# Patient Record
Sex: Female | Born: 1960
Health system: Southern US, Community
[De-identification: ages and names within clinical notes are randomized; demographics above are authoritative.]

## PROBLEM LIST (undated history)

## (undated) DIAGNOSIS — J309 Allergic rhinitis, unspecified: Secondary | ICD-10-CM

## (undated) DIAGNOSIS — Z1501 Genetic susceptibility to malignant neoplasm of breast: Secondary | ICD-10-CM

## (undated) DIAGNOSIS — Z1509 Genetic susceptibility to other malignant neoplasm: Secondary | ICD-10-CM

## (undated) DIAGNOSIS — R3129 Other microscopic hematuria: Secondary | ICD-10-CM

## (undated) DIAGNOSIS — M5410 Radiculopathy, site unspecified: Secondary | ICD-10-CM

## (undated) DIAGNOSIS — Z86010 Personal history of colonic polyps: Secondary | ICD-10-CM

## (undated) DIAGNOSIS — Z1589 Genetic susceptibility to other disease: Secondary | ICD-10-CM

## (undated) HISTORY — DX: Genetic susceptibility to other disease: Z15.89

## (undated) HISTORY — DX: Other microscopic hematuria: R31.29

## (undated) HISTORY — DX: Allergic rhinitis, unspecified: J30.9

## (undated) HISTORY — PX: OTOPLASTY: SUR998

## (undated) HISTORY — DX: Personal history of colonic polyps: Z86.010

## (undated) HISTORY — PX: TONSILLECTOMY: SUR1361

## (undated) HISTORY — DX: Genetic susceptibility to other malignant neoplasm: Z15.09

## (undated) HISTORY — PX: BARTHOLIN CYST MARSUPIALIZATION: SHX5383

## (undated) HISTORY — PX: OTHER SURGICAL HISTORY: SHX169

## (undated) HISTORY — DX: Radiculopathy, site unspecified: M54.10

## (undated) HISTORY — DX: Genetic susceptibility to malignant neoplasm of breast: Z15.01

---

## 2002-10-28 ENCOUNTER — Encounter: Admission: RE | Admit: 2002-10-28 | Discharge: 2002-10-28 | Payer: Self-pay | Admitting: Obstetrics and Gynecology

## 2002-10-28 ENCOUNTER — Encounter: Payer: Self-pay | Admitting: Obstetrics and Gynecology

## 2003-05-08 ENCOUNTER — Encounter: Admission: RE | Admit: 2003-05-08 | Discharge: 2003-05-08 | Payer: Self-pay | Admitting: Obstetrics and Gynecology

## 2003-05-18 ENCOUNTER — Other Ambulatory Visit: Admission: RE | Admit: 2003-05-18 | Discharge: 2003-05-18 | Payer: Self-pay | Admitting: Obstetrics and Gynecology

## 2004-05-08 ENCOUNTER — Encounter: Admission: RE | Admit: 2004-05-08 | Discharge: 2004-05-08 | Payer: Self-pay | Admitting: Obstetrics and Gynecology

## 2004-05-20 ENCOUNTER — Other Ambulatory Visit: Admission: RE | Admit: 2004-05-20 | Discharge: 2004-05-20 | Payer: Self-pay | Admitting: Obstetrics and Gynecology

## 2005-05-12 ENCOUNTER — Encounter: Admission: RE | Admit: 2005-05-12 | Discharge: 2005-05-12 | Payer: Self-pay | Admitting: Obstetrics and Gynecology

## 2005-06-03 ENCOUNTER — Other Ambulatory Visit: Admission: RE | Admit: 2005-06-03 | Discharge: 2005-06-03 | Payer: Self-pay | Admitting: Obstetrics and Gynecology

## 2006-05-20 ENCOUNTER — Encounter: Admission: RE | Admit: 2006-05-20 | Discharge: 2006-05-20 | Payer: Self-pay | Admitting: Obstetrics and Gynecology

## 2006-06-09 ENCOUNTER — Other Ambulatory Visit: Admission: RE | Admit: 2006-06-09 | Discharge: 2006-06-09 | Payer: Self-pay | Admitting: Obstetrics and Gynecology

## 2007-10-13 ENCOUNTER — Encounter: Admission: RE | Admit: 2007-10-13 | Discharge: 2007-10-13 | Payer: Self-pay | Admitting: Obstetrics and Gynecology

## 2007-10-20 ENCOUNTER — Encounter: Payer: Self-pay | Admitting: Obstetrics and Gynecology

## 2007-10-20 ENCOUNTER — Other Ambulatory Visit: Admission: RE | Admit: 2007-10-20 | Discharge: 2007-10-20 | Payer: Self-pay | Admitting: Obstetrics and Gynecology

## 2007-10-20 ENCOUNTER — Ambulatory Visit: Payer: Self-pay | Admitting: Obstetrics and Gynecology

## 2007-11-23 ENCOUNTER — Ambulatory Visit: Payer: Self-pay | Admitting: Family Medicine

## 2007-11-23 DIAGNOSIS — J309 Allergic rhinitis, unspecified: Secondary | ICD-10-CM

## 2007-11-23 DIAGNOSIS — Z860100 Personal history of colon polyps, unspecified: Secondary | ICD-10-CM

## 2007-11-23 DIAGNOSIS — E785 Hyperlipidemia, unspecified: Secondary | ICD-10-CM | POA: Insufficient documentation

## 2007-11-23 HISTORY — DX: Allergic rhinitis, unspecified: J30.9

## 2007-11-23 HISTORY — DX: Personal history of colon polyps, unspecified: Z86.0100

## 2007-11-23 HISTORY — DX: Personal history of colonic polyps: Z86.010

## 2007-11-25 LAB — CONVERTED CEMR LAB
Alkaline Phosphatase: 52 units/L (ref 39–117)
Bilirubin, Direct: 0.2 mg/dL (ref 0.0–0.3)
Chloride: 106 meq/L (ref 96–112)
Eosinophils Absolute: 0.1 10*3/uL (ref 0.0–0.7)
GFR calc Af Amer: 99 mL/min
GFR calc non Af Amer: 82 mL/min
HCT: 41.7 % (ref 36.0–46.0)
HDL: 35.4 mg/dL — ABNORMAL LOW (ref >39.0)
MCV: 96.8 fL (ref 78.0–100.0)
Monocytes Absolute: 0.4 10*3/uL (ref 0.1–1.0)
Monocytes Relative: 5.9 % (ref 3.0–12.0)
Neutrophils Relative %: 61.3 % (ref 43.0–77.0)
Platelets: 217 10*3/uL (ref 150–400)
Potassium: 3.6 meq/L (ref 3.5–5.1)
RDW: 11.8 % (ref 11.5–14.6)
Sodium: 144 meq/L (ref 135–145)
Total CHOL/HDL Ratio: 5
Triglycerides: 68 mg/dL (ref 0–149)
VLDL: 14 mg/dL (ref 0–40)

## 2008-10-26 ENCOUNTER — Ambulatory Visit: Payer: Self-pay | Admitting: Family Medicine

## 2008-10-26 LAB — CONVERTED CEMR LAB
Bilirubin Urine: NEGATIVE
Ketones, urine, test strip: NEGATIVE
Protein, U semiquant: NEGATIVE
Urobilinogen, UA: 0.2

## 2008-10-27 ENCOUNTER — Encounter: Payer: Self-pay | Admitting: Family Medicine

## 2008-10-30 ENCOUNTER — Telehealth: Payer: Self-pay | Admitting: Family Medicine

## 2009-06-01 ENCOUNTER — Encounter: Admission: RE | Admit: 2009-06-01 | Discharge: 2009-06-01 | Payer: Self-pay | Admitting: Obstetrics and Gynecology

## 2009-07-17 ENCOUNTER — Other Ambulatory Visit: Admission: RE | Admit: 2009-07-17 | Discharge: 2009-07-17 | Payer: Self-pay | Admitting: Obstetrics and Gynecology

## 2009-07-17 ENCOUNTER — Ambulatory Visit: Payer: Self-pay | Admitting: Obstetrics and Gynecology

## 2011-02-25 ENCOUNTER — Other Ambulatory Visit: Payer: Self-pay | Admitting: Obstetrics and Gynecology

## 2011-02-25 DIAGNOSIS — Z1231 Encounter for screening mammogram for malignant neoplasm of breast: Secondary | ICD-10-CM

## 2011-03-13 ENCOUNTER — Ambulatory Visit
Admission: RE | Admit: 2011-03-13 | Discharge: 2011-03-13 | Disposition: A | Discharge status: Home / Self Care | Payer: PRIVATE HEALTH INSURANCE | Source: Ambulatory Visit | Attending: Obstetrics and Gynecology | Admitting: Obstetrics and Gynecology

## 2011-03-13 DIAGNOSIS — Z1231 Encounter for screening mammogram for malignant neoplasm of breast: Secondary | ICD-10-CM

## 2011-10-16 ENCOUNTER — Encounter: Payer: Self-pay | Admitting: Gynecology

## 2011-10-27 ENCOUNTER — Other Ambulatory Visit (HOSPITAL_COMMUNITY)
Admission: RE | Admit: 2011-10-27 | Discharge: 2011-10-27 | Disposition: A | Discharge status: Home / Self Care | Payer: BC Managed Care – PPO | Source: Ambulatory Visit | Attending: Obstetrics and Gynecology | Admitting: Obstetrics and Gynecology

## 2011-10-27 ENCOUNTER — Encounter: Payer: Self-pay | Admitting: Obstetrics and Gynecology

## 2011-10-27 ENCOUNTER — Ambulatory Visit (INDEPENDENT_AMBULATORY_CARE_PROVIDER_SITE_OTHER): Payer: BC Managed Care – PPO | Admitting: Obstetrics and Gynecology

## 2011-10-27 VITALS — BP 110/70 | Ht 65.0 in | Wt 190.0 lb

## 2011-10-27 DIAGNOSIS — N912 Amenorrhea, unspecified: Secondary | ICD-10-CM

## 2011-10-27 DIAGNOSIS — Z01419 Encounter for gynecological examination (general) (routine) without abnormal findings: Secondary | ICD-10-CM

## 2011-10-27 NOTE — Patient Instructions (Signed)
Schedule appointment at cancer Center for genetic counseling BRCA1 and BRCA2. Even if blood test shows you are  menopausal continue to use condoms.

## 2011-10-27 NOTE — Progress Notes (Signed)
Patient came to see me today for her annual GYN exam. She has not had a period since February. She is having some hot flashes but not enough to require intervention. She is up-to-date on mammograms. She contracepts  by condom. She has had 3 family members developed breast cancer although they were all postmenopausal. Her paternal aunt has had ovarian cancer. The 3 family members with breast cancer were on her mother's side. She has never had an abnormal Pap smear. Her last Pap smear was 2011. She is having no abnormal bleeding or pelvic pain.  Physical examination:Kim Julian Reil present. HEENT within normal limits. Neck: Thyroid not large. No masses. Supraclavicular nodes: not enlarged. Breasts: Examined in both sitting and lying  position. No skin changes and no masses. Abdomen: Soft no guarding rebound or masses or hernia. Pelvic: External: Within normal limits. BUS: Within normal limits. Vaginal:within normal limits. Good estrogen effect. No evidence of cystocele rectocele or enterocele. Cervix: clean. Uterus: Normal size and shape. Adnexa: No masses. Rectovaginal exam: Confirmatory and negative. Extremities: Within normal limits.  Assessment: Secondary amenorrhea  Plan: FSH drawn. Nurse will call her with results. She understands about Provera withdrawal or bone density pending results. She will make an appointment for genetic counseling at the cancer center.The new Pap smear guidelines were discussed with the patient. Pap done.

## 2011-10-28 LAB — URINALYSIS W MICROSCOPIC + REFLEX CULTURE
Bacteria, UA: NONE SEEN
Bilirubin Urine: NEGATIVE
Casts: NONE SEEN
Crystals: NONE SEEN
Glucose, UA: NEGATIVE mg/dL
Hgb urine dipstick: NEGATIVE
Ketones, ur: NEGATIVE mg/dL
Leukocytes, UA: NEGATIVE
Nitrite: NEGATIVE
Protein, ur: NEGATIVE mg/dL
Specific Gravity, Urine: 1.018 (ref 1.005–1.030)
Squamous Epithelial / HPF: NONE SEEN
Urobilinogen, UA: 0.2 mg/dL (ref 0.0–1.0)
pH: 5 (ref 5.0–8.0)

## 2011-10-28 NOTE — Addendum Note (Signed)
Addended by: Trellis Paganini on: 10/28/2011 11:15 AM   Modules accepted: Orders

## 2011-12-05 ENCOUNTER — Ambulatory Visit (INDEPENDENT_AMBULATORY_CARE_PROVIDER_SITE_OTHER): Payer: BC Managed Care – PPO | Admitting: Family Medicine

## 2011-12-05 ENCOUNTER — Encounter: Payer: Self-pay | Admitting: Family Medicine

## 2011-12-05 ENCOUNTER — Telehealth: Payer: Self-pay | Admitting: Family Medicine

## 2011-12-05 VITALS — BP 100/70 | HR 65 | Temp 98.8°F | Ht 65.75 in | Wt 187.0 lb

## 2011-12-05 DIAGNOSIS — L989 Disorder of the skin and subcutaneous tissue, unspecified: Secondary | ICD-10-CM

## 2011-12-05 DIAGNOSIS — Z23 Encounter for immunization: Secondary | ICD-10-CM

## 2011-12-05 DIAGNOSIS — Z299 Encounter for prophylactic measures, unspecified: Secondary | ICD-10-CM

## 2011-12-05 DIAGNOSIS — M5410 Radiculopathy, site unspecified: Secondary | ICD-10-CM

## 2011-12-05 DIAGNOSIS — IMO0002 Reserved for concepts with insufficient information to code with codable children: Secondary | ICD-10-CM

## 2011-12-05 HISTORY — DX: Radiculopathy, site unspecified: M54.10

## 2011-12-05 LAB — BASIC METABOLIC PANEL
Calcium: 9.6 mg/dL (ref 8.4–10.5)
GFR: 71.81 mL/min (ref >60.00)
Glucose, Bld: 87 mg/dL (ref 70–99)
Sodium: 147 mEq/L — ABNORMAL HIGH (ref 135–145)

## 2011-12-05 LAB — LIPID PANEL
Cholesterol: 170 mg/dL (ref 0–200)
LDL Cholesterol: 116 mg/dL — ABNORMAL HIGH (ref 0–99)
VLDL: 8.2 mg/dL (ref 0.0–40.0)

## 2011-12-05 NOTE — Addendum Note (Signed)
Addended by: Azucena Freed on: 12/05/2011 10:08 AM   Modules accepted: Orders

## 2011-12-05 NOTE — Telephone Encounter (Signed)
Please let patient know: Her cholesterol is a little high, we recommend a healthy diet and regular exercise and will discuss at follow up. Her sodium and potassium were slightly elevated. Would advise stopping multivitamin and any supplements containing sodium or potassium and will recheck at follow up.
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
## 2011-12-05 NOTE — Patient Instructions (Addendum)
-  We have ordered labs or studies at this visit. It usually takes 1-2 weeks for results and processing. We will contact you with instructions IF your results are abnormal. Normal results will be released to your [facility] in 1-2 weeks. If you have not heard from [place] or can not find your results in [facility] in 2 weeks please contact our office.  We recommend the following healthy lifestyle measures: - eat a healthy diet consisting of lots of vegetables, fruits, beans, nuts, seeds, healthy meats such as white chicken and fish and whole grains.  - avoid fried foods, fast food, processed foods, sodas, red meet and other fattening foods.  - get a least 150 minutes of aerobic exercise per week.   Follow up in 2 months           Lumbosacral Radiculopathy Lumbosacral radiculopathy is a pinched nerve or nerves in the low back (lumbosacral area). When this happens you may have weakness in your legs and may not be able to stand on your toes. You may have pain going down into your legs. There may be difficulties with walking normally. There are many causes of this problem. Sometimes this may happen from an injury, or simply from arthritis or boney problems. It may also be caused by other illnesses such as diabetes. If there is no improvement after treatment, further studies may be done to find the exact cause. DIAGNOSIS  X-rays may be needed if the problems become long standing. Electromyograms may be done. This study is one in which the working of nerves and muscles is studied. HOME CARE INSTRUCTIONS   Applications of ice packs may be helpful. Ice can be used in a plastic bag with a towel around it to prevent frostbite to skin. This may be used every 2 hours for 20 to 30 minutes, or as needed, while awake, or as directed by your caregiver.  Only take over-the-counter or prescription medicines for pain, discomfort, or fever as directed by your caregiver.  If physical therapy was prescribed, follow your  caregiver's directions. SEEK IMMEDIATE MEDICAL CARE IF:   You have pain not controlled with medications.  You seem to be getting worse rather than better.  You develop increasing weakness in your legs.  You develop loss of bowel or bladder control.  You have difficulty with walking or balance, or develop clumsiness in the use of your legs.  You have a fever. MAKE SURE YOU:   Understand these instructions.  Will watch your condition.  Will get help right away if you are not doing well or get worse. Document Released: 02/03/2005 Document Revised: 04/28/2011 Document Reviewed: 09/24/2007 [facility] Patient Information 2013 [place], [place].

## 2011-12-05 NOTE — Telephone Encounter (Signed)
Called and spoke with pt and pt is aware.  

## 2011-12-30 ENCOUNTER — Encounter: Payer: Self-pay | Admitting: Obstetrics and Gynecology

## 2012-02-04 ENCOUNTER — Ambulatory Visit: Payer: BC Managed Care – PPO | Admitting: Family Medicine

## 2012-03-24 ENCOUNTER — Other Ambulatory Visit: Payer: Self-pay | Admitting: Gynecology

## 2012-03-24 DIAGNOSIS — Z1231 Encounter for screening mammogram for malignant neoplasm of breast: Secondary | ICD-10-CM

## 2012-04-12 ENCOUNTER — Ambulatory Visit
Admission: RE | Admit: 2012-04-12 | Discharge: 2012-04-12 | Disposition: A | Discharge status: Home / Self Care | Payer: BC Managed Care – PPO | Source: Ambulatory Visit | Attending: Gynecology | Admitting: Gynecology

## 2012-04-12 DIAGNOSIS — Z1231 Encounter for screening mammogram for malignant neoplasm of breast: Secondary | ICD-10-CM

## 2012-05-26 ENCOUNTER — Encounter: Payer: Self-pay | Admitting: Family Medicine

## 2012-05-26 ENCOUNTER — Ambulatory Visit (INDEPENDENT_AMBULATORY_CARE_PROVIDER_SITE_OTHER): Payer: BC Managed Care – PPO | Admitting: Family Medicine

## 2012-05-26 VITALS — BP 120/80 | HR 68 | Temp 98.7°F | Wt 186.0 lb

## 2012-05-26 DIAGNOSIS — R350 Frequency of micturition: Secondary | ICD-10-CM

## 2012-05-26 LAB — POCT URINALYSIS DIPSTICK
Glucose, UA: NEGATIVE
Nitrite, UA: NEGATIVE
Urobilinogen, UA: 0.2
pH, UA: 8.5

## 2012-05-26 MED ORDER — CIPROFLOXACIN HCL 500 MG PO TABS: 500.0000 mg | ORAL_TABLET | Freq: Two times a day (BID) | ORAL | Status: DC

## 2012-05-26 NOTE — Patient Instructions (Addendum)
-  plenty of water, blueberry and cranberry juice are good too  -take all of the antibiotic  -can take Azo if you need to for the discomfort  -a culture is pending and we will call you if we need to change your medication  -return if worsening or persists

## 2012-05-26 NOTE — Progress Notes (Signed)
Chief Complaint  Patient presents with  . Urinary Frequency    abd sensivity, pressure, feverish     HPI:  Acute visit ? UTI: -started about 4-5 days ago -symptoms: urinary frequency, urgency, different color to urine and dysurine -denies: fevers, flank pain, and, vaginal symptoms, blood in urine -has had uti once 3 years ago and this felt same  ROS: See pertinent positives and negatives per HPI.  No past medical history on file.  Family History  Problem Relation Age of Onset  . Breast cancer Mother     Age 59  . Heart disease Mother   . Breast cancer Maternal Aunt     Age 54's  . Breast cancer Maternal Grandmother     Age 41's  . Colon cancer Paternal Grandmother   . Ovarian cancer Paternal Aunt     History   Social History  . Marital Status: Married    Spouse Name: N/A    Number of Children: N/A  . Years of Education: N/A   Social History Main Topics  . Smoking status: Former Smoker    Quit date: 12/04/1996  . Smokeless tobacco: None  . Alcohol Use: 0.5 oz/week    1 drink(s) per week  . Drug Use: None  . Sexually Active: Yes    Birth Control/ Protection: Condom   Other Topics Concern  . None   Social History Narrative  . None    Current outpatient prescriptions:Cholecalciferol (VITAMIN D PO), Take by mouth., Disp: , Rfl: ;  Omega-3 Fatty Acids (FISH OIL PO), Take by mouth., Disp: , Rfl: ;  Ascorbic Acid (VITAMIN C PO), Take by mouth., Disp: , Rfl: ;  ciprofloxacin (CIPRO) 500 MG tablet, Take 1 tablet (500 mg total) by mouth 2 (two) times daily., Disp: 6 tablet, Rfl: 0;  Multiple Vitamin (MULTIVITAMIN) tablet, Take 1 tablet by mouth daily., Disp: , Rfl:  Pseudoephedrine HCl (SUDAFED 24 HOUR PO), Take by mouth., Disp: , Rfl:   EXAM:  Filed Vitals:   05/26/12 0917  BP: 120/80  Pulse: 68  Temp: 98.7 F (37.1 C)    Body mass index is 30.25 kg/(m^2).  GENERAL: vitals reviewed and listed above, alert, oriented, appears well hydrated and in no acute  distress  HEENT: atraumatic, conjunttiva clear, no obvious abnormalities on inspection of external nose and ears  NECK: no obvious masses on inspection  LUNGS: clear to auscultation bilaterally, no wheezes, rales or rhonchi, good air movement  CV: HRRR, no peripheral edema  ABD: BS+, soft, NTTP, no CVA tenderness  MS: moves all extremities without noticeable abnormality  PSYCH: pleasant and cooperative, no obvious depression or anxiety  ASSESSMENT AND PLAN:  Discussed the following assessment and plan:  Urinary frequency - Plan: POCT urinalysis dipstick, Culture, Urine, ciprofloxacin (CIPRO) 500 MG tablet  -hx and dip suggest likely UTI - will with cipro - risks discussed, culture pending -Patient advised to return or notify a doctor immediately if symptoms worsen or persist or new concerns arise.  Patient Instructions  -plenty of water, blueberry and cranberry juice are good too  -take all of the antibiotic  -can take Azo if you need to for the discomfort  -a culture is pending and we will call you if we need to change your medication  -return if worsening or persists      KIM, HANNAH R.

## 2012-05-28 ENCOUNTER — Telehealth: Payer: Self-pay | Admitting: Family Medicine

## 2012-05-28 DIAGNOSIS — R300 Dysuria: Secondary | ICD-10-CM

## 2012-05-28 NOTE — Telephone Encounter (Signed)
Left a message for pt at designated cell phone number.  

## 2012-05-28 NOTE — Telephone Encounter (Signed)
Urine culture is negative. Drink plenty of water and return in 1 week for lab appt to recheck your urine.

## 2012-06-03 ENCOUNTER — Encounter: Payer: Self-pay | Admitting: Family Medicine

## 2012-06-10 ENCOUNTER — Other Ambulatory Visit (INDEPENDENT_AMBULATORY_CARE_PROVIDER_SITE_OTHER): Payer: BC Managed Care – PPO

## 2012-06-10 ENCOUNTER — Telehealth: Payer: Self-pay | Admitting: Family Medicine

## 2012-06-10 ENCOUNTER — Other Ambulatory Visit: Payer: BC Managed Care – PPO | Admitting: Family Medicine

## 2012-06-10 DIAGNOSIS — R300 Dysuria: Secondary | ICD-10-CM

## 2012-06-10 DIAGNOSIS — R3129 Other microscopic hematuria: Secondary | ICD-10-CM

## 2012-06-10 LAB — URINALYSIS, ROUTINE W REFLEX MICROSCOPIC
Bilirubin Urine: NEGATIVE
Total Protein, Urine: NEGATIVE
Urine Glucose: NEGATIVE
pH: 6.5 (ref 5.0–8.0)

## 2012-06-10 NOTE — Progress Notes (Unsigned)
Doesn't need doctor visit - just lab appt for urine micro

## 2012-06-10 NOTE — Telephone Encounter (Signed)
Please let her know urine did show a little bit of blood. I would advise she see a urologist for this and the urine symptoms she had - she did not have an infection to explain this. There are many causes of this and many are benign, but just want to make sure. Kristina Pham, Please place referral for urology for microscopic hematuria. Thanks.

## 2012-06-10 NOTE — Addendum Note (Signed)
Addended by: Azucena Freed on: 06/10/2012 03:06 PM   Modules accepted: Orders

## 2012-06-10 NOTE — Telephone Encounter (Signed)
Called and spoke with pt and pt is aware. Pt wanted referral to urology.  Referral ordered.

## 2012-06-29 ENCOUNTER — Encounter: Payer: Self-pay | Admitting: Family Medicine

## 2012-06-29 NOTE — Progress Notes (Signed)
REcieved urology OV notes from 06/28/12. Microscopic hematuria, plan per notes to repeat urine studies and if abnormal further eval - question small stone versus subclinical UTI.

## 2012-08-27 ENCOUNTER — Encounter: Payer: Self-pay | Admitting: Family Medicine

## 2012-08-27 NOTE — Progress Notes (Signed)
Received urology OV note from 7/9. Pt underwent thorough eval of microscopic hematuria with urology with CT and cystoscopy. Benign hematuria. To follow up with gyn for uterine fibroid. Incidental spondylosis L4-L5. Placed in scan box.

## 2012-11-09 ENCOUNTER — Encounter: Payer: Self-pay | Admitting: Gynecology

## 2012-11-09 ENCOUNTER — Ambulatory Visit (INDEPENDENT_AMBULATORY_CARE_PROVIDER_SITE_OTHER): Payer: BC Managed Care – PPO | Admitting: Gynecology

## 2012-11-09 VITALS — BP 122/76 | Ht 66.0 in | Wt 184.0 lb

## 2012-11-09 DIAGNOSIS — N926 Irregular menstruation, unspecified: Secondary | ICD-10-CM

## 2012-11-09 DIAGNOSIS — N951 Menopausal and female climacteric states: Secondary | ICD-10-CM

## 2012-11-09 DIAGNOSIS — Z01419 Encounter for gynecological examination (general) (routine) without abnormal findings: Secondary | ICD-10-CM

## 2012-11-09 NOTE — Patient Instructions (Addendum)
Followup in one year for annual exam. Followup sooner if prolonged or atypical bleeding.

## 2012-11-09 NOTE — Progress Notes (Signed)
Kristina Pham 1960/09/29 [id number]        52 y.o.  [id number] for annual exam.  Former patient of Dr. Eda Paschal. Several issues noted below.  Past medical history,surgical history, medications, allergies, family history and social history were all reviewed and documented in the EPIC chart.  ROS:  Performed and pertinent positives and negatives are included in the history, assessment and plan .  Exam: Kim assistant Filed Vitals:   11/09/12 1459  BP: 122/76  Height: 5\' 6"  (1.676 m)  Weight: 184 lb (83.462 kg)   General appearance  Normal Skin grossly normal Head/Neck normal with no cervical or supraclavicular adenopathy thyroid normal Lungs  clear Cardiac RR, without RMG Abdominal  soft, nontender, without masses, organomegaly or hernia Breasts  examined lying and sitting without masses, retractions, discharge or axillary adenopathy. Pelvic  Ext/BUS/vagina  normal  Cervix  normal  Uterus  retroverted, normal size, shape and contour, midline and mobile nontender   Adnexa  Without masses or tenderness    Anus and perineum  normal   Rectovaginal  normal sphincter tone without palpated masses or tenderness.    Assessment/Plan:  52 y.o. [id number] female for annual exam.   1. Perimenopausal/irregular menses. Patient's LMP June 2014. Her period before this was 3 months. No prolonged or atypical bleeding. Starting to have some hot flushes and night sweats. FSH 20 last year. Discussed the perimenopause. She will keep a menstrual calendar. As long as regular although less frequent menses and will follow. Prolonged atypical bleeding she does report this. Options for symptoms to include HRT was reviewed. Patient does not feel they are bad enough now to warrant this. Patient will follow at present. 2. Mammography 03/2012. Family history of mother, maternal grandmother and maternal aunt all with postmenopausal breast cancer. Had discussed genetic testing with Dr. Eda Paschal. Never followed up for this.  I reviewed again the possibility and offered genetic testing. Patient declines at this time is going to talk to her mother and see if she would be willing to be tested. Continue annual mammography. SBE monthly reviewed. 3. Pap smear 2013. No Pap smear done today. No history of abnormal Pap smears. Plan repeat at 3 year interval. 4. Colonoscopy 2010. Repeat at their recommended interval. 5. Health maintenance. No blood work tenderness it is all done through her primary physician's office. Followup one year, sooner as needed.   Note: This document was prepared with digital dictation and possible smart phrase technology. Any transcriptional errors that result from this process are unintentional.   Dara Lords MD, 3:30 PM 11/09/2012

## 2012-11-10 ENCOUNTER — Encounter: Payer: Self-pay | Admitting: Gynecology

## 2012-12-22 ENCOUNTER — Encounter: Payer: BC Managed Care – PPO | Admitting: Family Medicine

## 2012-12-23 ENCOUNTER — Other Ambulatory Visit: Payer: Self-pay

## 2013-01-06 ENCOUNTER — Ambulatory Visit (INDEPENDENT_AMBULATORY_CARE_PROVIDER_SITE_OTHER): Payer: BC Managed Care – PPO | Admitting: Family Medicine

## 2013-01-06 ENCOUNTER — Encounter: Payer: Self-pay | Admitting: Family Medicine

## 2013-01-06 VITALS — BP 110/72 | Temp 98.4°F | Ht 65.75 in | Wt 187.0 lb

## 2013-01-06 DIAGNOSIS — Z23 Encounter for immunization: Secondary | ICD-10-CM

## 2013-01-06 DIAGNOSIS — J309 Allergic rhinitis, unspecified: Secondary | ICD-10-CM

## 2013-01-06 DIAGNOSIS — H938X2 Other specified disorders of left ear: Secondary | ICD-10-CM

## 2013-01-06 DIAGNOSIS — H938X9 Other specified disorders of ear, unspecified ear: Secondary | ICD-10-CM

## 2013-01-06 DIAGNOSIS — Z0000 Encounter for general adult medical examination without abnormal findings: Secondary | ICD-10-CM

## 2013-01-06 DIAGNOSIS — R319 Hematuria, unspecified: Secondary | ICD-10-CM

## 2013-01-06 DIAGNOSIS — E875 Hyperkalemia: Secondary | ICD-10-CM

## 2013-01-06 LAB — BASIC METABOLIC PANEL
CO2: 32 mEq/L (ref 19–32)
Chloride: 104 mEq/L (ref 96–112)
Sodium: 144 mEq/L (ref 135–145)

## 2013-01-06 MED ORDER — FLUTICASONE PROPIONATE 50 MCG/ACT NA SUSP: 2.0000 | Freq: Every day | NASAL | Status: DC

## 2013-01-06 NOTE — Patient Instructions (Addendum)
-  afrin for 5 days then stop  -start the flonase  -call ear nose and throat doctor about the noise in your ear if persists   -We have ordered labs or studies at this visit. It can take up to 1-2 weeks for results and processing. We will contact you with instructions IF your results are abnormal. Normal results will be released to your [facility]. If you have not heard from [place] or can not find your results in [facility] in 2 weeks please contact our office.  We recommend the following healthy lifestyle measures: - eat a healthy diet consisting of lots of vegetables, fruits, beans, nuts, seeds, healthy meats such as white chicken and fish and whole grains.  - avoid fried foods, fast food, processed foods, sodas, red meet and other fattening foods.  - get a least 150 minutes of aerobic exercise per week.   -follow up in 1 year or as needed

## 2013-01-06 NOTE — Progress Notes (Signed)
Chief Complaint  Patient presents with  . Annual Exam    HPI:  Here for CPE:  -Concerns today:   1) Always has had pulse in ear with bending forward: - a little more frequent when leans forward now for a few months -does not hear it any other time, no ringing or hearing loss -has had some nasal congestion frequently with her allergies  2)wants to recheck potassium -a little up last check  -Diet: variety of foods, balance and well rounded  -Vit D and Calcium: takes fish oil and vitamin D  -Exercise: CV 2-3 days per week  -Diabetes and Dyslipidemia Screening: done one year ago with mildly elevated cholesterol  -Hx of HTN: no  -Vaccines: UTD  -pap history: sees gyn (Dr. Audie Box) for yearly paps - sept 2013 last pap - all normal  -FDLMP: postmenopausal  -FH breast, colon or ovarian ca: see FH -gets mammograms yearly and normal  -Alcohol, Tobacco, drug use: see social history  Review of Systems - Review of Systems  Constitutional: Negative for fever, weight loss and malaise/fatigue.  HENT: Negative for ear pain and hearing loss.   Eyes: Negative for blurred vision and pain.  Respiratory: Negative for cough and shortness of breath.   Cardiovascular: Negative for chest pain, palpitations and leg swelling.  Gastrointestinal: Negative for abdominal pain, blood in stool and melena.  Genitourinary: Negative for dysuria and frequency.  Musculoskeletal: Negative for falls and myalgias.  Skin: Negative for rash.  Neurological: Negative for dizziness and headaches.  Endo/Heme/Allergies: Does not bruise/bleed easily.  Psychiatric/Behavioral: Negative for depression and suicidal ideas.    No past medical history on file.  Past Surgical History  Procedure Laterality Date  . Tonsillectomy    . Otoplasty    . Bone marrow extraction    . Bartholin cyst marsupialization      Family History  Problem Relation Age of Onset  . Breast cancer Mother     Age 63  . Heart  disease Mother   . Breast cancer Maternal Aunt     Age 109's  . Breast cancer Maternal Grandmother     Age 57's  . Colon cancer Paternal Grandmother   . Ovarian cancer Paternal Aunt     History   Social History  . Marital Status: Married    Spouse Name: N/A    Number of Children: N/A  . Years of Education: N/A   Social History Main Topics  . Smoking status: Former Smoker    Quit date: 12/04/1996  . Smokeless tobacco: None  . Alcohol Use: 0.5 oz/week    1 drink(s) per week  . Drug Use: No  . Sexual Activity: Yes    Birth Control/ Protection: Condom   Other Topics Concern  . None   Social History Narrative  . None    Current outpatient prescriptions:Cholecalciferol (VITAMIN D PO), Take by mouth., Disp: , Rfl: ;  Omega-3 Fatty Acids (FISH OIL PO), Take by mouth., Disp: , Rfl: ;  Pseudoephedrine HCl (SUDAFED 24 HOUR PO), Take by mouth., Disp: , Rfl: ;  fluticasone (FLONASE) 50 MCG/ACT nasal spray, Place 2 sprays into both nostrils daily., Disp: 16 g, Rfl: 6  EXAM:  Filed Vitals:   01/06/13 1432  BP: 110/72  Temp: 98.4 F (36.9 C)    GENERAL: vitals reviewed and listed below, alert, oriented, appears well hydrated and in no acute distress  HEENT: head atraumatic, PERRLA, normal appearance of eyes, ears, nose and mouth. moist mucus membranes.normal appearance  of ear canals and TMs except clear effusion mild, clear nasal congestion, mild post oropharyngeal erythema with PND, no tonsillar edema or exudate, no sinus TTP   NECK: supple, no masses or lymphadenopathy  LUNGS: clear to auscultation bilaterally, no rales, rhonchi or wheeze  CV: HRRR, no peripheral edema or cyanosis, normal pedal pulses  BREAST: does with gyn  ABDOMEN: bowel sounds normal, soft, non tender to palpation, no masses, no rebound or guarding  GU: does with gyn  RECTAL: refused  SKIN: no rash or abnormal lesions  MS: normal gait, moves all extremities normally  NEURO: CN II-XII grossly  intact, normal muscle strength and sensation to light touch on extremities  PSYCH: normal affect, pleasant and cooperative  ASSESSMENT AND PLAN:  Discussed the following assessment and plan:  Visit for preventive health examination  Need for prophylactic vaccination and inoculation against influenza - Plan: Flu Vaccine QUAD 36+ mos PF IM (Fluarix)  Pounding noise in ear, left - Plan: fluticasone (FLONASE) 50 MCG/ACT nasal spray  Hematuria  Hyperkalemia - Plan: Basic metabolic panel  ALLERGIC RHINITIS  -Discussed and advised all [place] preventive services health task force level A and B recommendations for age, sex and risks.  -Advised at least 150 minutes of exercise per week and a healthy diet low in saturated fats and sweets and consisting of fresh fruits and vegetables, lean meats such as fish and white chicken and whole grains.  -query eustachian tube dysfunction - discussed etiologies of pulsingin ears including serious pathologies and advised she see ENT or trial of flonase for allergies/eustachian tube dysfunction and see ENT if persists  -labs, studies and vaccines per orders this encounter  Orders Placed This Encounter  Procedures  . Flu Vaccine QUAD 36+ mos PF IM (Fluarix)  . Basic metabolic panel    Patient Instructions  -afrin for 5 days then stop  -start the flonase  -call ear nose and throat doctor about the noise in your ear if persists   -We have ordered labs or studies at this visit. It can take up to 1-2 weeks for results and processing. We will contact you with instructions IF your results are abnormal. Normal results will be released to your [facility]. If you have not heard from [place] or can not find your results in [facility] in 2 weeks please contact our office.  We recommend the following healthy lifestyle measures: - eat a healthy diet consisting of lots of vegetables, fruits, beans, nuts, seeds, healthy meats such as white chicken and fish and whole grains.   - avoid fried foods, fast food, processed foods, sodas, red meet and other fattening foods.  - get a least 150 minutes of aerobic exercise per week.   -follow up in 1 year or as needed           Patient advised to return to clinic immediately if symptoms worsen or persist or new concerns.    Return in about 1 year (around 01/06/2014), or if symptoms worsen or fail to improve.  Kristina Basque R.

## 2013-01-06 NOTE — Progress Notes (Signed)
Pre visit review using our clinic review tool, if applicable. No additional management support is needed unless otherwise documented below in the visit note. 

## 2013-04-20 ENCOUNTER — Other Ambulatory Visit: Payer: Self-pay

## 2013-04-20 DIAGNOSIS — Z1231 Encounter for screening mammogram for malignant neoplasm of breast: Secondary | ICD-10-CM

## 2013-05-10 ENCOUNTER — Ambulatory Visit: Payer: BC Managed Care – PPO

## 2013-05-17 ENCOUNTER — Ambulatory Visit
Admission: RE | Admit: 2013-05-17 | Discharge: 2013-05-17 | Disposition: A | Discharge status: Home / Self Care | Payer: BC Managed Care – PPO | Source: Ambulatory Visit

## 2013-05-17 DIAGNOSIS — Z1231 Encounter for screening mammogram for malignant neoplasm of breast: Secondary | ICD-10-CM

## 2013-12-12 ENCOUNTER — Ambulatory Visit (INDEPENDENT_AMBULATORY_CARE_PROVIDER_SITE_OTHER): Payer: BC Managed Care – PPO | Admitting: Gynecology

## 2013-12-12 ENCOUNTER — Encounter: Payer: Self-pay | Admitting: Gynecology

## 2013-12-12 VITALS — BP 120/70 | Ht 66.0 in | Wt 187.0 lb

## 2013-12-12 DIAGNOSIS — Z01419 Encounter for gynecological examination (general) (routine) without abnormal findings: Secondary | ICD-10-CM

## 2013-12-12 NOTE — Progress Notes (Signed)
Kristina Pham 04-04-1960 [id number]        53 y.o.  G2P2002 for annual exam.  Doing well without complaints.  Past medical history,surgical history, problem list, medications, allergies, family history and social history were all reviewed and documented as reviewed in the EPIC chart.  ROS:  12 system ROS performed with pertinent positives and negatives included in the history, assessment and plan.   Additional significant findings :  none   Exam: Kim Counsellor Vitals:   12/12/13 0750  BP: 120/70  Height: _0  (1.676 m)  Weight: 187 lb (84.823 kg)   General appearance:  Normal affect, orientation and appearance. Skin: Grossly normal HEENT: Without gross lesions.  No cervical or supraclavicular adenopathy. Thyroid normal.  Lungs:  Clear without wheezing, rales or rhonchi Cardiac: RR, without RMG Abdominal:  Soft, nontender, without masses, guarding, rebound, organomegaly or hernia Breasts:  Examined lying and sitting without masses, retractions, discharge or axillary adenopathy. Pelvic:  Ext/BUS/vagina normal  Cervix normal  Uterus axial to anteverted, normal size, shape and contour, midline and mobile nontender   Adnexa  Without masses or tenderness    Anus and perineum  Normal   Rectovaginal  Normal sphincter tone without palpated masses or tenderness.    Assessment/Plan:  53 y.o. G49P2002 female for annual exam.   1. Postmenopausal. Patient without menses greater than 1 year. LMP approximately 07/2012. No bleeding at all. No significant hot flushes, night sweats, vaginal dryness or dyspareunia. Continue to monitor. Report any issues or vaginal bleeding. 2. Pap smear 2013. No Pap smear done today. No history of significant abnormal Pap smears. Plan Pap smear next year at three-year interval. 3. Mammography 04/2013. Continue with annual mammography. Strong family history of breast cancer in mother, maternal grandmother and maternal aunt. Did talk to genetic counselor a while  ago. Recommend follow up appointment with genetic counselor to rediscuss possible BRCA testing. Patient agrees to arrange.SBE monthly reviewed. 4. Colonoscopy 2013. Repeat at their recommended interval. 5. DEXA never. Will plan further into the menopause. Increase calcium and vitamin D reviewed. 6. Health maintenance. No routine blood work done this this is done at Dr. Julianne Rice office. She has an appointment to see him next month. Follow up in 1 year, sooner as needed.     Anastasio Auerbach MD, 8:11 AM 12/12/2013

## 2013-12-12 NOTE — Patient Instructions (Signed)
You may obtain a copy of any labs that were done today by logging onto MyChart as outlined in the instructions provided with your AVS (after visit summary). The office will not call with normal lab results but certainly if there are any significant abnormalities then we will contact you.   Health Maintenance, Female A healthy lifestyle and preventative care can promote health and wellness.  Maintain regular health, dental, and eye exams.  Eat a healthy diet. Foods like vegetables, fruits, whole grains, low-fat dairy products, and lean protein foods contain the nutrients you need without too many calories. Decrease your intake of foods high in solid fats, added sugars, and salt. Get information about a proper diet from your caregiver, if necessary.  Regular physical exercise is one of the most important things you can do for your health. Most adults should get at least 150 minutes of moderate-intensity exercise (any activity that increases your heart rate and causes you to sweat) each week. In addition, most adults need muscle-strengthening exercises on 2 or more days a week.   Maintain a healthy weight. The body mass index (BMI) is a screening tool to identify possible weight problems. It provides an estimate of body fat based on height and weight. Your caregiver can help determine your BMI, and can help you achieve or maintain a healthy weight. For adults 20 years and older:  A BMI below 18.5 is considered underweight.  A BMI of 18.5 to 24.9 is normal.  A BMI of 25 to 29.9 is considered overweight.  A BMI of 30 and above is considered obese.  Maintain normal blood lipids and cholesterol by exercising and minimizing your intake of saturated fat. Eat a balanced diet with plenty of fruits and vegetables. Blood tests for lipids and cholesterol should begin at age 61 and be repeated every 5 years. If your lipid or cholesterol levels are high, you are over 50, or you are a high risk for heart  disease, you may need your cholesterol levels checked more frequently.Ongoing high lipid and cholesterol levels should be treated with medicines if diet and exercise are not effective.  If you smoke, find out from your caregiver how to quit. If you do not use tobacco, do not start.  Lung cancer screening is recommended for adults aged 33 80 years who are at high risk for developing lung cancer because of a history of smoking. Yearly low-dose computed tomography (CT) is recommended for people who have at least a 30-pack-year history of smoking and are a current smoker or have quit within the past 15 years. A pack year of smoking is smoking an average of 1 pack of cigarettes a day for 1 year (for example: 1 pack a day for 30 years or 2 packs a day for 15 years). Yearly screening should continue until the smoker has stopped smoking for at least 15 years. Yearly screening should also be stopped for people who develop a health problem that would prevent them from having lung cancer treatment.  If you are pregnant, do not drink alcohol. If you are breastfeeding, be very cautious about drinking alcohol. If you are not pregnant and choose to drink alcohol, do not exceed 1 drink per day. One drink is considered to be 12 ounces (355 mL) of beer, 5 ounces (148 mL) of wine, or 1.5 ounces (44 mL) of liquor.  Avoid use of street drugs. Do not share needles with anyone. Ask for help if you need support or instructions about stopping  the use of drugs.  High blood pressure causes heart disease and increases the risk of stroke. Blood pressure should be checked at least every 1 to 2 years. Ongoing high blood pressure should be treated with medicines, if weight loss and exercise are not effective.  If you are 59 to 53 years old, ask your caregiver if you should take aspirin to prevent strokes.  Diabetes screening involves taking a blood sample to check your fasting blood sugar level. This should be done once every 3  years, after age 91, if you are within normal weight and without risk factors for diabetes. Testing should be considered at a younger age or be carried out more frequently if you are overweight and have at least 1 risk factor for diabetes.  Breast cancer screening is essential preventative care for women. You should practice "breast self-awareness." This means understanding the normal appearance and feel of your breasts and may include breast self-examination. Any changes detected, no matter how small, should be reported to a caregiver. Women in their 66s and 30s should have a clinical breast exam (CBE) by a caregiver as part of a regular health exam every 1 to 3 years. After age 101, women should have a CBE every year. Starting at age 100, women should consider having a mammogram (breast X-ray) every year. Women who have a family history of breast cancer should talk to their caregiver about genetic screening. Women at a high risk of breast cancer should talk to their caregiver about having an MRI and a mammogram every year.  Breast cancer gene (BRCA)-related cancer risk assessment is recommended for women who have family members with BRCA-related cancers. BRCA-related cancers include breast, ovarian, tubal, and peritoneal cancers. Having family members with these cancers may be associated with an increased risk for harmful changes (mutations) in the breast cancer genes BRCA1 and BRCA2. Results of the assessment will determine the need for genetic counseling and BRCA1 and BRCA2 testing.  The Pap test is a screening test for cervical cancer. Women should have a Pap test starting at age 57. Between ages 25 and 35, Pap tests should be repeated every 2 years. Beginning at age 37, you should have a Pap test every 3 years as long as the past 3 Pap tests have been normal. If you had a hysterectomy for a problem that was not cancer or a condition that could lead to cancer, then you no longer need Pap tests. If you are  between ages 50 and 76, and you have had normal Pap tests going back 10 years, you no longer need Pap tests. If you have had past treatment for cervical cancer or a condition that could lead to cancer, you need Pap tests and screening for cancer for at least 20 years after your treatment. If Pap tests have been discontinued, risk factors (such as a new sexual partner) need to be reassessed to determine if screening should be resumed. Some women have medical problems that increase the chance of getting cervical cancer. In these cases, your caregiver may recommend more frequent screening and Pap tests.  The human papillomavirus (HPV) test is an additional test that may be used for cervical cancer screening. The HPV test looks for the virus that can cause the cell changes on the cervix. The cells collected during the Pap test can be tested for HPV. The HPV test could be used to screen women aged 44 years and older, and should be used in women of any age  who have unclear Pap test results. After the age of 55, women should have HPV testing at the same frequency as a Pap test.  Colorectal cancer can be detected and often prevented. Most routine colorectal cancer screening begins at the age of 44 and continues through age 20. However, your caregiver may recommend screening at an earlier age if you have risk factors for colon cancer. On a yearly basis, your caregiver may provide home test kits to check for hidden blood in the stool. Use of a small camera at the end of a tube, to directly examine the colon (sigmoidoscopy or colonoscopy), can detect the earliest forms of colorectal cancer. Talk to your caregiver about this at age 86, when routine screening begins. Direct examination of the colon should be repeated every 5 to 10 years through age 13, unless early forms of pre-cancerous polyps or small growths are found.  Hepatitis C blood testing is recommended for all people born from [birth year] through 1965 and any  individual with known risks for hepatitis C.  Practice safe sex. Use condoms and avoid high-risk sexual practices to reduce the spread of sexually transmitted infections (STIs). Sexually active women aged 36 and younger should be checked for Chlamydia, which is a common sexually transmitted infection. Older women with new or multiple partners should also be tested for Chlamydia. Testing for other STIs is recommended if you are sexually active and at increased risk.  Osteoporosis is a disease in which the bones lose minerals and strength with aging. This can result in serious bone fractures. The risk of osteoporosis can be identified using a bone density scan. Women ages 20 and over and women at risk for fractures or osteoporosis should discuss screening with their caregivers. Ask your caregiver whether you should be taking a calcium supplement or vitamin D to reduce the rate of osteoporosis.  Menopause can be associated with physical symptoms and risks. Hormone replacement therapy is available to decrease symptoms and risks. You should talk to your caregiver about whether hormone replacement therapy is right for you.  Use sunscreen. Apply sunscreen liberally and repeatedly throughout the day. You should seek shade when your shadow is shorter than you. Protect yourself by wearing long sleeves, pants, a wide-brimmed hat, and sunglasses year round, whenever you are outdoors.  Notify your caregiver of new moles or changes in moles, especially if there is a change in shape or color. Also notify your caregiver if a mole is larger than the size of a pencil eraser.  Stay current with your immunizations. Document Released: 08/19/2010 Document Revised: 05/31/2012 Document Reviewed: 08/19/2010 [facility] Patient Information 2014 [employer].

## 2013-12-13 LAB — URINALYSIS W MICROSCOPIC + REFLEX CULTURE
BACTERIA UA: NONE SEEN
Bilirubin Urine: NEGATIVE
CASTS: NONE SEEN
Crystals: NONE SEEN
GLUCOSE, UA: NEGATIVE mg/dL
HGB URINE DIPSTICK: NEGATIVE
Ketones, ur: NEGATIVE mg/dL
Leukocytes, UA: NEGATIVE
NITRITE: NEGATIVE
Protein, ur: NEGATIVE mg/dL
Specific Gravity, Urine: 1.006 (ref 1.005–1.030)
Squamous Epithelial / LPF: NONE SEEN
Urobilinogen, UA: 0.2 mg/dL (ref 0.0–1.0)
pH: 7 (ref 5.0–8.0)

## 2013-12-19 ENCOUNTER — Encounter: Payer: Self-pay | Admitting: Gynecology

## 2014-01-09 ENCOUNTER — Other Ambulatory Visit (INDEPENDENT_AMBULATORY_CARE_PROVIDER_SITE_OTHER): Payer: BC Managed Care – PPO

## 2014-01-09 DIAGNOSIS — Z0000 Encounter for general adult medical examination without abnormal findings: Secondary | ICD-10-CM

## 2014-01-09 LAB — HEPATIC FUNCTION PANEL
ALK PHOS: 59 U/L (ref 39–117)
ALT: 22 U/L (ref 0–35)
AST: 21 U/L (ref 0–37)
Albumin: 4.3 g/dL (ref 3.5–5.2)
BILIRUBIN TOTAL: 0.5 mg/dL (ref 0.2–1.2)
Bilirubin, Direct: 0.1 mg/dL (ref 0.0–0.3)
Total Protein: 6.7 g/dL (ref 6.0–8.3)

## 2014-01-09 LAB — CBC WITH DIFFERENTIAL/PLATELET
Basophils Absolute: 0 10*3/uL (ref 0.0–0.1)
Basophils Relative: 0.8 % (ref 0.0–3.0)
EOS ABS: 0.1 10*3/uL (ref 0.0–0.7)
EOS PCT: 3 % (ref 0.0–5.0)
HEMATOCRIT: 43.7 % (ref 36.0–46.0)
Hemoglobin: 14.3 g/dL (ref 12.0–15.0)
LYMPHS ABS: 1.7 10*3/uL (ref 0.7–4.0)
Lymphocytes Relative: 37.3 % (ref 12.0–46.0)
MCHC: 32.8 g/dL (ref 30.0–36.0)
MCV: 97.7 fl (ref 78.0–100.0)
MONO ABS: 0.3 10*3/uL (ref 0.1–1.0)
Monocytes Relative: 6.4 % (ref 3.0–12.0)
Neutro Abs: 2.4 10*3/uL (ref 1.4–7.7)
Neutrophils Relative %: 52.5 % (ref 43.0–77.0)
PLATELETS: 181 10*3/uL (ref 150.0–400.0)
RBC: 4.47 Mil/uL (ref 3.87–5.11)
RDW: 13.1 % (ref 11.5–15.5)
WBC: 4.7 10*3/uL (ref 4.0–10.5)

## 2014-01-09 LAB — BASIC METABOLIC PANEL
BUN: 20 mg/dL (ref 6–23)
CALCIUM: 9.4 mg/dL (ref 8.4–10.5)
CO2: 25 meq/L (ref 19–32)
CREATININE: 0.9 mg/dL (ref 0.4–1.2)
Chloride: 105 mEq/L (ref 96–112)
GFR: 71.23 mL/min (ref >60.00)
GLUCOSE: 84 mg/dL (ref 70–99)
Potassium: 4.5 mEq/L (ref 3.5–5.1)
SODIUM: 140 meq/L (ref 135–145)

## 2014-01-09 LAB — LIPID PANEL
Cholesterol: 189 mg/dL (ref 0–200)
HDL: 49.3 mg/dL (ref >39.00)
LDL Cholesterol: 129 mg/dL — ABNORMAL HIGH (ref 0–99)
NONHDL: 139.7
Total CHOL/HDL Ratio: 4
Triglycerides: 53 mg/dL (ref 0.0–149.0)
VLDL: 10.6 mg/dL (ref 0.0–40.0)

## 2014-01-09 LAB — TSH: TSH: 2.82 u[IU]/mL (ref 0.35–4.50)

## 2014-01-09 LAB — POCT URINALYSIS DIPSTICK
Bilirubin, UA: NEGATIVE
GLUCOSE UA: NEGATIVE
Ketones, UA: NEGATIVE
Leukocytes, UA: NEGATIVE
NITRITE UA: NEGATIVE
Protein, UA: NEGATIVE
Spec Grav, UA: <=1.005
UROBILINOGEN UA: 0.2
pH, UA: 5

## 2014-01-10 ENCOUNTER — Other Ambulatory Visit: Payer: Self-pay | Admitting: *Deleted

## 2014-01-10 DIAGNOSIS — R8290 Unspecified abnormal findings in urine: Secondary | ICD-10-CM

## 2014-01-16 ENCOUNTER — Encounter: Payer: BC Managed Care – PPO | Admitting: Family Medicine

## 2014-01-24 ENCOUNTER — Other Ambulatory Visit (INDEPENDENT_AMBULATORY_CARE_PROVIDER_SITE_OTHER): Payer: BC Managed Care – PPO

## 2014-01-24 DIAGNOSIS — R8290 Unspecified abnormal findings in urine: Secondary | ICD-10-CM

## 2014-01-24 LAB — URINALYSIS, MICROSCOPIC ONLY

## 2014-02-27 ENCOUNTER — Ambulatory Visit (INDEPENDENT_AMBULATORY_CARE_PROVIDER_SITE_OTHER): Payer: BLUE CROSS/BLUE SHIELD | Admitting: Family Medicine

## 2014-02-27 ENCOUNTER — Encounter: Payer: Self-pay | Admitting: Family Medicine

## 2014-02-27 VITALS — BP 100/70 | HR 66 | Temp 98.5°F | Ht 66.0 in | Wt 190.7 lb

## 2014-02-27 DIAGNOSIS — H938X2 Other specified disorders of left ear: Secondary | ICD-10-CM

## 2014-02-27 DIAGNOSIS — Z86010 Personal history of colonic polyps: Secondary | ICD-10-CM

## 2014-02-27 DIAGNOSIS — Z0000 Encounter for general adult medical examination without abnormal findings: Secondary | ICD-10-CM

## 2014-02-27 MED ORDER — FLUTICASONE PROPIONATE 50 MCG/ACT NA SUSP: 2.0000 | Freq: Every day | NASAL | Status: DC

## 2014-02-27 NOTE — Progress Notes (Signed)
Pre visit review using our clinic review tool, if applicable. No additional management support is needed unless otherwise documented below in the visit note. 

## 2014-02-27 NOTE — Progress Notes (Signed)
HPI:  Here for CPE:  Unfortunately her mother was recently diagnosed with lung cancer. He monther was a smoker and she reports her oncologist reported it was due to smoking. She feels fine herself without any complaints today.  Hyperlipidemia: -mild - diet and exercise advised - she is working on this  Allergies: -needs refill on flonase to get through spring -denies: current flare  -Diet: variety of foods, balance and well rounded - carbs are a problem  -Exercise: regular exercise cardio 30 minutes 3-4 days per week  -Taking folic acid, vitamin D or calcium: no calcium, take vitamin D  -Diabetes and Dyslipidemia Screening: labs UTD  -Hx of HTN: no  -Vaccines: flu vaccine indicated and offered  -pap history: sees Dr. Audie Box for this  -FDLMP: n/a  -sexual activity: yes, female partner, no new partners  -wants STI testing: no  -FH breast, colon or ovarian ca: see FH Last mammogram: sees Dr. Audie Box for breast health Last colon cancer screening:  UTD - due in 2018  Breast Ca Risk Assessment: sees Dr. Audie Box for breast health  -Alcohol, Tobacco, drug use: see social history  Review of Systems - no fevers, unintentional weight loss, vision loss, hearing loss, chest pain, sob, hemoptysis, melena, hematochezia, hematuria, genital discharge, changing or concerning skin lesions, bleeding, bruising, loc, thoughts of self harm or SI  Past Medical History  Diagnosis Date  . Radiculopathy 12/05/2011  . ALLERGIC RHINITIS 11/23/2007    Qualifier: Diagnosis of  By: Linna Darner, CMA, Cindy    . History of colonic polyps 11/23/2007    Repeat colonoscopy advised in 2018 per GI report    . Microscopic hematuria     had work up with urologist in the past for this    Past Surgical History  Procedure Laterality Date  . Tonsillectomy    . Otoplasty    . Bone marrow extraction    . Bartholin cyst marsupialization      Family History  Problem Relation Age of Onset  . Breast cancer  Mother     Age 60  . Heart disease Mother   . Cancer Mother     lung ca for smoking  . Breast cancer Maternal Aunt     Age 82's  . Cancer Maternal Aunt     Lung  . Breast cancer Maternal Grandmother     Age 20's  . Colon cancer Paternal Grandmother   . Ovarian cancer Paternal Aunt     History   Social History  . Marital Status: Married    Spouse Name: N/A    Number of Children: N/A  . Years of Education: N/A   Social History Main Topics  . Smoking status: Former Smoker    Quit date: 12/04/1996  . Smokeless tobacco: None  . Alcohol Use: 1.0 oz/week    2 Not specified per week  . Drug Use: No  . Sexual Activity: Yes    Birth Control/ Protection: Condom   Other Topics Concern  . None   Social History Narrative    Current outpatient prescriptions: Cholecalciferol (VITAMIN D PO), Take by mouth., Disp: , Rfl: ;  fluticasone (FLONASE) 50 MCG/ACT nasal spray, Place 2 sprays into both nostrils daily., Disp: 16 g, Rfl: 6;  Omega-3 Fatty Acids (FISH OIL PO), Take by mouth., Disp: , Rfl: ;  Pseudoephedrine HCl (SUDAFED 24 HOUR PO), Take by mouth., Disp: , Rfl:   EXAM:  Filed Vitals:   02/27/14 1439  BP: 100/70  Pulse: 66  Temp: 98.5 F (36.9 C)    GENERAL: vitals reviewed and listed below, alert, oriented, appears well hydrated and in no acute distress  HEENT: head atraumatic, PERRLA, normal appearance of eyes, ears, nose and mouth. moist mucus membranes.  NECK: supple, no masses or lymphadenopathy  LUNGS: clear to auscultation bilaterally, no rales, rhonchi or wheeze  CV: HRRR, no peripheral edema or cyanosis, normal pedal pulses  BREAST: declined - does with gyn  ABDOMEN: bowel sounds normal, soft, non tender to palpation, no masses, no rebound or guarding  GU: declined - does with gyn  RECTAL: deferred  SKIN: no rash or abnormal lesions  MS: normal gait, moves all extremities normally  NEURO: CN II-XII grossly intact, normal muscle strength and  sensation to light touch on extremities  PSYCH: normal affect, pleasant and cooperative  ASSESSMENT AND PLAN:  Discussed the following assessment and plan:  Visit for preventive health examination  History of colonic polyps  Pounding noise in ear, left - Plan: fluticasone (FLONASE) 50 MCG/ACT nasal spray  -Discussed and advised all [place] preventive services health task force level A and B recommendations for age, sex and risks.  -we talked about lung cancer screening.   -Advised at least 150 minutes of exercise per week and a healthy diet low in saturated fats and sweets and consisting of fresh fruits and vegetables, lean meats such as fish and white chicken and whole grains.    No orders of the defined types were placed in this encounter.    Patient advised to return to clinic immediately if symptoms worsen or persist or new concerns.  Patient Instructions  Before you leave: -6 month follow up  We recommend the following healthy lifestyle measures: - eat a healthy diet consisting of lots of vegetables, fruits, beans, nuts, seeds, healthy meats such as white chicken and fish and whole grains.  - avoid fried foods, fast food, processed foods, sodas, red meet and other fattening foods.  - get a least 150 minutes of aerobic exercise per week.      No Follow-up on file.  Kriste BasqueKIM, HANNAH R.

## 2014-02-27 NOTE — Patient Instructions (Addendum)
Before you leave: -6 month follow up  We recommend the following healthy lifestyle measures: - eat a healthy diet consisting of lots of vegetables, fruits, beans, nuts, seeds, healthy meats such as white chicken and fish and whole grains.  - avoid fried foods, fast food, processed foods, sodas, red meet and other fattening foods.  - get a least 150 minutes of aerobic exercise per week.

## 2014-05-15 ENCOUNTER — Other Ambulatory Visit: Payer: Self-pay

## 2014-05-15 DIAGNOSIS — Z1231 Encounter for screening mammogram for malignant neoplasm of breast: Secondary | ICD-10-CM

## 2014-05-29 ENCOUNTER — Ambulatory Visit
Admission: RE | Admit: 2014-05-29 | Discharge: 2014-05-29 | Disposition: A | Discharge status: Home / Self Care | Payer: BLUE CROSS/BLUE SHIELD | Source: Ambulatory Visit

## 2014-05-29 DIAGNOSIS — Z1231 Encounter for screening mammogram for malignant neoplasm of breast: Secondary | ICD-10-CM

## 2014-08-24 ENCOUNTER — Telehealth: Payer: Self-pay | Admitting: Family Medicine

## 2014-08-24 NOTE — Telephone Encounter (Signed)
Yes, please come fasting to recheck cholesterol.

## 2014-08-24 NOTE — Telephone Encounter (Signed)
Pt would like to know if this will be a fasting appt, b/c you and she have been working on her cholesterol and she wasn't sure what this appt was for. pls advise, and thanks!!

## 2014-08-24 NOTE — Telephone Encounter (Signed)
Patient informed. 

## 2014-08-28 ENCOUNTER — Ambulatory Visit (INDEPENDENT_AMBULATORY_CARE_PROVIDER_SITE_OTHER): Payer: BLUE CROSS/BLUE SHIELD | Admitting: Family Medicine

## 2014-08-28 ENCOUNTER — Encounter: Payer: Self-pay | Admitting: Family Medicine

## 2014-08-28 VITALS — BP 102/68 | HR 69 | Temp 98.4°F | Ht 66.0 in | Wt 179.4 lb

## 2014-08-28 DIAGNOSIS — E785 Hyperlipidemia, unspecified: Secondary | ICD-10-CM

## 2014-08-28 DIAGNOSIS — E663 Overweight: Secondary | ICD-10-CM | POA: Diagnosis not present

## 2014-08-28 LAB — LIPID PANEL
CHOL/HDL RATIO: 3
CHOLESTEROL: 184 mg/dL (ref 0–200)
HDL: 54.4 mg/dL (ref >39.00)
LDL CALC: 121 mg/dL — AB (ref 0–99)
NonHDL: 129.6
Triglycerides: 43 mg/dL (ref 0.0–149.0)
VLDL: 8.6 mg/dL (ref 0.0–40.0)

## 2014-08-28 NOTE — Progress Notes (Signed)
HPI:  Hyperlipidemia: -mild - diet and exercise advised - she is working on this -doing low carb diet, regular exercise -taking fish oil -FASTING today  Allergies: -needs refill on flonase to get through spring -denies: current flare  ROS: See pertinent positives and negatives per HPI.  Past Medical History  Diagnosis Date  . Radiculopathy 12/05/2011  . ALLERGIC RHINITIS 11/23/2007    Qualifier: Diagnosis of  By: Linna DarnerWyrick, CMA, Cindy    . History of colonic polyps 11/23/2007    Repeat colonoscopy advised in 2018 per GI report    . Microscopic hematuria     had work up with urologist in the past for this    Past Surgical History  Procedure Laterality Date  . Tonsillectomy    . Otoplasty    . Bone marrow extraction    . Bartholin cyst marsupialization      Family History  Problem Relation Age of Onset  . Breast cancer Mother     Age 54  . Heart disease Mother   . Cancer Mother     lung ca for smoking  . Breast cancer Maternal Aunt     Age 54's  . Cancer Maternal Aunt     Lung  . Breast cancer Maternal Grandmother     Age 54's  . Colon cancer Paternal Grandmother   . Ovarian cancer Paternal Aunt     History   Social History  . Marital Status: Married    Spouse Name: N/A  . Number of Children: N/A  . Years of Education: N/A   Social History Main Topics  . Smoking status: Former Smoker    Quit date: 12/04/1996  . Smokeless tobacco: Not on file  . Alcohol Use: 1.0 oz/week    2 Standard drinks or equivalent per week  . Drug Use: No  . Sexual Activity: Yes    Birth Control/ Protection: Condom   Other Topics Concern  . None   Social History Narrative     Current outpatient prescriptions:  .  Cholecalciferol (VITAMIN D PO), Take by mouth., Disp: , Rfl:  .  fluticasone (FLONASE) 50 MCG/ACT nasal spray, Place 2 sprays into both nostrils daily., Disp: 16 g, Rfl: 6 .  Omega-3 Fatty Acids (FISH OIL PO), Take by mouth., Disp: , Rfl:   EXAM:  Filed  Vitals:   08/28/14 0842  BP: 102/68  Pulse: 69  Temp: 98.4 F (36.9 C)    Body mass index is 28.97 kg/(m^2).  GENERAL: vitals reviewed and listed above, alert, oriented, appears well hydrated and in no acute distress  HEENT: atraumatic, conjunttiva clear, no obvious abnormalities on inspection of external nose and ears  NECK: no obvious masses on inspection  LUNGS: clear to auscultation bilaterally, no wheezes, rales or rhonchi, good air movement  CV: HRRR, no peripheral edema  MS: moves all extremities without noticeable abnormality  PSYCH: pleasant and cooperative, no obvious depression or anxiety  ASSESSMENT AND PLAN:  Discussed the following assessment and plan:  Hyperlipemia - Plan: Lipid Panel  Overweight  -FASTING labs today -follow up yearly -Patient advised to return or notify a doctor immediately if symptoms worsen or persist or new concerns arise.  Patient Instructions  BEFORE YOU LEAVE: -labs -follow up yearly for physical  We recommend the following healthy lifestyle measures: - eat a healthy diet consisting of lots of vegetables, fruits, beans, nuts, seeds, healthy meats such as white chicken and fish   - avoid fried foods, fast food, processed foods,  sodas, red meet and other fattening foods.  - get a least 150 minutes of aerobic exercise per week.       Kristina Pham R.

## 2014-08-28 NOTE — Patient Instructions (Signed)
BEFORE YOU LEAVE: -labs -follow up yearly for physical  We recommend the following healthy lifestyle measures: - eat a healthy diet consisting of lots of vegetables, fruits, beans, nuts, seeds, healthy meats such as white chicken and fish   - avoid fried foods, fast food, processed foods, sodas, red meet and other fattening foods.  - get a least 150 minutes of aerobic exercise per week.

## 2014-08-28 NOTE — Progress Notes (Signed)
Pre visit review using our clinic review tool, if applicable. No additional management support is needed unless otherwise documented below in the visit note. 

## 2014-12-25 ENCOUNTER — Ambulatory Visit (INDEPENDENT_AMBULATORY_CARE_PROVIDER_SITE_OTHER): Payer: BLUE CROSS/BLUE SHIELD | Admitting: Gynecology

## 2014-12-25 ENCOUNTER — Encounter: Payer: Self-pay | Admitting: Gynecology

## 2014-12-25 ENCOUNTER — Telehealth: Payer: Self-pay | Admitting: *Deleted

## 2014-12-25 ENCOUNTER — Other Ambulatory Visit (HOSPITAL_COMMUNITY)
Admission: RE | Admit: 2014-12-25 | Discharge: 2014-12-25 | Disposition: A | Discharge status: Home / Self Care | Payer: BLUE CROSS/BLUE SHIELD | Source: Ambulatory Visit | Attending: Gynecology | Admitting: Gynecology

## 2014-12-25 VITALS — BP 120/76 | Ht 66.0 in | Wt 176.0 lb

## 2014-12-25 DIAGNOSIS — Z23 Encounter for immunization: Secondary | ICD-10-CM

## 2014-12-25 DIAGNOSIS — N841 Polyp of cervix uteri: Secondary | ICD-10-CM

## 2014-12-25 DIAGNOSIS — N952 Postmenopausal atrophic vaginitis: Secondary | ICD-10-CM | POA: Diagnosis not present

## 2014-12-25 DIAGNOSIS — Z01411 Encounter for gynecological examination (general) (routine) with abnormal findings: Secondary | ICD-10-CM | POA: Insufficient documentation

## 2014-12-25 DIAGNOSIS — Z803 Family history of malignant neoplasm of breast: Secondary | ICD-10-CM

## 2014-12-25 DIAGNOSIS — Z01419 Encounter for gynecological examination (general) (routine) without abnormal findings: Secondary | ICD-10-CM | POA: Diagnosis not present

## 2014-12-25 NOTE — Telephone Encounter (Signed)
-----   Message from Dara Lordsimothy P Fontaine, MD sent at 12/25/2014  9:54 AM EST ----- Oncology genetic counseling appointment reference mother, maternal grandmother, maternal aunt with breast cancer

## 2014-12-25 NOTE — Addendum Note (Signed)
Addended by: Dayna BarkerGARDNER, Charda Janis K on: 12/25/2014 10:19 AM   Modules accepted: Orders

## 2014-12-25 NOTE — Progress Notes (Signed)
Kristina Gibneyatricia Pham 08-May-1960 Kristina Pham        54 y.o.  G2P2002  Patient's last menstrual period was 07/18/2012. for annual exam.  Doing well.  Past medical history,surgical history, problem list, medications, allergies, family history and social history were all reviewed and documented as reviewed in the EPIC chart.  ROS:  Performed with pertinent positives and negatives included in the history, assessment and plan.   Additional significant findings :  none   Exam: [facility] person[facility]istant Filed Vitals:   12/25/14 0920  BP: 120/76  Height: 5\' 6"  (1.676 m)  Weight: 176 lb (79.833 kg)   General appearance:  Normal affect, orientation and appearance. Skin: Grossly normal HEENT: Without gross lesions.  No cervical or supraclavicular adenopathy. Thyroid normal.  Lungs:  Clear without wheezing, rales or rhonchi Cardiac: RR, without RMG Abdominal:  Soft, nontender, without masses, guarding, rebound, organomegaly or hernia Breasts:  Examined lying and sitting without masses, retractions, discharge or axillary adenopathy. Pelvic:  Ext/BUS/vagina normal with mild atrophic changes  Cervix with small endocervical polyp, biopsied off. Pap smear done  Uterus anteverted, normal size, shape and contour, midline and mobile nontender   Adnexa  Without masses or tenderness    Anus and perineum  Normal   Rectovaginal  Normal sphincter tone without palpated masses or tenderness.    Assessment/Plan:  54 y.o. [id number] female for annual exam.   1. Small benign-appearing endocervical polyp. Patient will follow up for biopsy results. 2. Pap smear 2013. Pap smear done today. No history of abnormal Pap smears previously. 3. Mammography 05/2014. Continue with annual mammography when due. Strong family history of breast cancer in mother, maternal grandmother and maternal aunt. Never followed up with genetic counseling as I discussed last year. I again recommended she do so when she agrees with this will go ahead and help  her make these arrangements. 4. Colonoscopy 2013. Repeat at their recommended interval. 5. DEXA never. Will plan further into the menopause. 6. Health maintenance. No routine lab work done as patient reports this done at her primary physician's office. Follow up 1 year, sooner as needed.   Dara LordsFONTAINE,Jennaya Pogue P MD, 9:51 AM 12/25/2014

## 2014-12-25 NOTE — Telephone Encounter (Signed)
Referral placed they will contact pt to schedule. 

## 2014-12-25 NOTE — Patient Instructions (Signed)
Office will call you with biopsy results.  You may obtain a copy of any labs that were done today by logging onto MyChart as outlined in the instructions provided with your AVS (after visit summary). The office will not call with normal lab results but certainly if there are any significant abnormalities then we will contact you.   Health Maintenance Adopting a healthy lifestyle and getting preventive care can go a long way to promote health and wellness. Talk with your health care provider about what schedule of regular examinations is right for you. This is a good chance for you to check in with your provider about disease prevention and staying healthy. In between checkups, there are plenty of things you can do on your own. Experts have done a lot of research about which lifestyle changes and preventive measures are most likely to keep you healthy. Ask your health care provider for more information. WEIGHT AND DIET  Eat a healthy diet  Be sure to include plenty of vegetables, fruits, low-fat dairy products, and lean protein.  Do not eat a lot of foods high in solid fats, added sugars, or salt.  Get regular exercise. This is one of the most important things you can do for your health.  Most adults should exercise for at least 150 minutes each week. The exercise should increase your heart rate and make you sweat (moderate-intensity exercise).  Most adults should also do strengthening exercises at least twice a week. This is in addition to the moderate-intensity exercise.  Maintain a healthy weight  Body mass index (BMI) is a measurement that can be used to identify possible weight problems. It estimates body fat based on height and weight. Your health care provider can help determine your BMI and help you achieve or maintain a healthy weight.  For females 20 years of age and older:   A BMI below 18.5 is considered underweight.  A BMI of 18.5 to 24.9 is normal.  A BMI of 25 to 29.9 is  considered overweight.  A BMI of 30 and above is considered obese.  Watch levels of cholesterol and blood lipids  You should start having your blood tested for lipids and cholesterol at 54 years of age, then have this test every 5 years.  You may need to have your cholesterol levels checked more often if:  Your lipid or cholesterol levels are high.  You are older than 54 years of age.  You are at high risk for heart disease.  CANCER SCREENING   Lung Cancer  Lung cancer screening is recommended for adults 55-80 years old who are at high risk for lung cancer because of a history of smoking.  A yearly low-dose CT scan of the lungs is recommended for people who:  Currently smoke.  Have quit within the past 15 years.  Have at least a 30-pack-year history of smoking. A pack year is smoking an average of one pack of cigarettes a day for 1 year.  Yearly screening should continue until it has been 15 years since you quit.  Yearly screening should stop if you develop a health problem that would prevent you from having lung cancer treatment.  Breast Cancer  Practice breast self-awareness. This means understanding how your breasts normally appear and feel.  It also means doing regular breast self-exams. Let your health care provider know about any changes, no matter how small.  If you are in your 20s or 30s, you should have a clinical breast exam (  CBE) by a health care provider every 1-3 years as part of a regular health exam.  If you are 56 or older, have a CBE every year. Also consider having a breast X-ray (mammogram) every year.  If you have a family history of breast cancer, talk to your health care provider about genetic screening.  If you are at high risk for breast cancer, talk to your health care provider about having an MRI and a mammogram every year.  Breast cancer gene (BRCA) assessment is recommended for women who have family members with BRCA-related cancers.  BRCA-related cancers include:  Breast.  Ovarian.  Tubal.  Peritoneal cancers.  Results of the assessment will determine the need for genetic counseling and BRCA1 and BRCA2 testing. Cervical Cancer Routine pelvic examinations to screen for cervical cancer are no longer recommended for nonpregnant women who are considered low risk for cancer of the pelvic organs (ovaries, uterus, and vagina) and who do not have symptoms. A pelvic examination may be necessary if you have symptoms including those associated with pelvic infections. Ask your health care provider if a screening pelvic exam is right for you.   The Pap test is the screening test for cervical cancer for women who are considered at risk.  If you had a hysterectomy for a problem that was not cancer or a condition that could lead to cancer, then you no longer need Pap tests.  If you are older than 65 years, and you have had normal Pap tests for the past 10 years, you no longer need to have Pap tests.  If you have had past treatment for cervical cancer or a condition that could lead to cancer, you need Pap tests and screening for cancer for at least 20 years after your treatment.  If you no longer get a Pap test, assess your risk factors if they change (such as having a new sexual partner). This can affect whether you should start being screened again.  Some women have medical problems that increase their chance of getting cervical cancer. If this is the case for you, your health care provider may recommend more frequent screening and Pap tests.  The human papillomavirus (HPV) test is another test that may be used for cervical cancer screening. The HPV test looks for the virus that can cause cell changes in the cervix. The cells collected during the Pap test can be tested for HPV.  The HPV test can be used to screen women 39 years of age and older. Getting tested for HPV can extend the interval between normal Pap tests from three to  five years.  An HPV test also should be used to screen women of any age who have unclear Pap test results.  After 54 years of age, women should have HPV testing as often as Pap tests.  Colorectal Cancer  This type of cancer can be detected and often prevented.  Routine colorectal cancer screening usually begins at 54 years of age and continues through 54 years of age.  Your health care provider may recommend screening at an earlier age if you have risk factors for colon cancer.  Your health care provider may also recommend using home test kits to check for hidden blood in the stool.  A small camera at the end of a tube can be used to examine your colon directly (sigmoidoscopy or colonoscopy). This is done to check for the earliest forms of colorectal cancer.  Routine screening usually begins at age 77.  Direct examination of the colon should be repeated every 5-10 years through 54 years of age. However, you may need to be screened more often if early forms of precancerous polyps or small growths are found. Skin Cancer  Check your skin from head to toe regularly.  Tell your health care provider about any new moles or changes in moles, especially if there is a change in a mole's shape or color.  Also tell your health care provider if you have a mole that is larger than the size of a pencil eraser.  Always use sunscreen. Apply sunscreen liberally and repeatedly throughout the day.  Protect yourself by wearing long sleeves, pants, a wide-brimmed hat, and sunglasses whenever you are outside. HEART DISEASE, DIABETES, AND HIGH BLOOD PRESSURE   Have your blood pressure checked at least every 1-2 years. High blood pressure causes heart disease and increases the risk of stroke.  If you are between 76 years and 55 years old, ask your health care provider if you should take aspirin to prevent strokes.  Have regular diabetes screenings. This involves taking a blood sample to check your  fasting blood sugar level.  If you are at a normal weight and have a low risk for diabetes, have this test once every three years after 54 years of age.  If you are overweight and have a high risk for diabetes, consider being tested at a younger age or more often. PREVENTING INFECTION  Hepatitis B  If you have a higher risk for hepatitis B, you should be screened for this virus. You are considered at high risk for hepatitis B if:  You were born in a country where hepatitis B is common. Ask your health care provider which countries are considered high risk.  Your parents were born in a high-risk country, and you have not been immunized against hepatitis B (hepatitis B vaccine).  You have HIV or AIDS.  You use needles to inject street drugs.  You live with someone who has hepatitis B.  You have had sex with someone who has hepatitis B.  You get hemodialysis treatment.  You take certain medicines for conditions, including cancer, organ transplantation, and autoimmune conditions. Hepatitis C  Blood testing is recommended for:  Everyone born from [birth year] through 1965.  Anyone with known risk factors for hepatitis C. Sexually transmitted infections (STIs)  You should be screened for sexually transmitted infections (STIs) including gonorrhea and chlamydia if:  You are sexually active and are younger than 54 years of age.  You are older than 54 years of age and your health care provider tells you that you are at risk for this type of infection.  Your sexual activity has changed since you were last screened and you are at an increased risk for chlamydia or gonorrhea. Ask your health care provider if you are at risk.  If you do not have HIV, but are at risk, it may be recommended that you take a prescription medicine daily to prevent HIV infection. This is called pre-exposure prophylaxis (PrEP). You are considered at risk if:  You are sexually active and do not regularly use condoms or  know the HIV status of your partner(s).  You take drugs by injection.  You are sexually active with a partner who has HIV. Talk with your health care provider about whether you are at high risk of being infected with HIV. If you choose to begin PrEP, you should first be tested for HIV. You should then be  tested every 3 months for as long as you are taking PrEP.  PREGNANCY   If you are premenopausal and you may become pregnant, ask your health care provider about preconception counseling.  If you may become pregnant, take 400 to 800 micrograms (mcg) of folic acid every day.  If you want to prevent pregnancy, talk to your health care provider about birth control (contraception). OSTEOPOROSIS AND MENOPAUSE   Osteoporosis is a disease in which the bones lose minerals and strength with aging. This can result in serious bone fractures. Your risk for osteoporosis can be identified using a bone density scan.  If you are 25 years of age or older, or if you are at risk for osteoporosis and fractures, ask your health care provider if you should be screened.  Ask your health care provider whether you should take a calcium or vitamin D supplement to lower your risk for osteoporosis.  Menopause may have certain physical symptoms and risks.  Hormone replacement therapy may reduce some of these symptoms and risks. Talk to your health care provider about whether hormone replacement therapy is right for you.  HOME CARE INSTRUCTIONS   Schedule regular health, dental, and eye exams.  Stay current with your immunizations.   Do not use any tobacco products including cigarettes, chewing tobacco, or electronic cigarettes.  If you are pregnant, do not drink alcohol.  If you are breastfeeding, limit how much and how often you drink alcohol.  Limit alcohol intake to no more than 1 drink per day for nonpregnant women. One drink equals 12 ounces of beer, 5 ounces of wine, or 1 ounces of hard liquor.  Do  not use street drugs.  Do not share needles.  Ask your health care provider for help if you need support or information about quitting drugs.  Tell your health care provider if you often feel depressed.  Tell your health care provider if you have ever been abused or do not feel safe at home. Document Released: 08/19/2010 Document Revised: 06/20/2013 Document Reviewed: 01/05/2013 [facility] Patient Information 2015 [place], [place]. This information is not intended to replace advice given to you by your health care provider. Make sure you discuss any questions you have with your health care provider.

## 2014-12-26 ENCOUNTER — Telehealth: Payer: Self-pay | Admitting: Genetic Counselor

## 2014-12-26 NOTE — Telephone Encounter (Signed)
Spoke with pt regarding genetic counseling appt and confirmed date

## 2014-12-27 LAB — CYTOLOGY - PAP

## 2014-12-27 NOTE — Telephone Encounter (Signed)
Appointment on 01/15/15

## 2015-01-15 ENCOUNTER — Ambulatory Visit (HOSPITAL_BASED_OUTPATIENT_CLINIC_OR_DEPARTMENT_OTHER): Payer: BLUE CROSS/BLUE SHIELD | Admitting: Genetic Counselor

## 2015-01-15 ENCOUNTER — Other Ambulatory Visit: Payer: BLUE CROSS/BLUE SHIELD

## 2015-01-15 ENCOUNTER — Encounter: Payer: Self-pay | Admitting: Genetic Counselor

## 2015-01-15 DIAGNOSIS — Z86010 Personal history of colonic polyps: Secondary | ICD-10-CM

## 2015-01-15 DIAGNOSIS — Z800 Family history of malignant neoplasm of digestive organs: Secondary | ICD-10-CM | POA: Insufficient documentation

## 2015-01-15 DIAGNOSIS — Z8041 Family history of malignant neoplasm of ovary: Secondary | ICD-10-CM | POA: Insufficient documentation

## 2015-01-15 DIAGNOSIS — Z315 Encounter for genetic counseling: Secondary | ICD-10-CM | POA: Diagnosis not present

## 2015-01-15 DIAGNOSIS — Z803 Family history of malignant neoplasm of breast: Secondary | ICD-10-CM | POA: Diagnosis not present

## 2015-01-15 NOTE — Progress Notes (Signed)
REFERRING PROVIDER: Lucretia Kern, DO [place], [place] [address]   Donalynn Furlong, MD  PRIMARY PROVIDER:  Lucretia Kern., DO  PRIMARY REASON FOR VISIT:  1. Family history of breast cancer   2. Family history of ovarian cancer   3. Family history of colon cancer   4. History of colonic polyps      HISTORY OF PRESENT ILLNESS:   Kristina Pham, a 54 y.o. female, was seen for a [place] cancer genetics consultation at the request of Dr. Phineas Real due to a family history of cancer.  Ms. Flansburg presents to clinic today to discuss the possibility of a hereditary predisposition to cancer, genetic testing, and to further clarify her future cancer risks, as well as potential cancer risks for family members. Ms. Mcinnis is a 54 y.o. female with no personal history of cancer.  She reports that her mother filled out paperwork for genetic testing when she was diagnosed but never underwent genetic testing.  CANCER HISTORY:   No history exists.     HORMONAL RISK FACTORS:  Menarche was at age 68.  First live birth at age 89.  OCP use for approximately 15 years.  Ovaries intact: yes.  Hysterectomy: no.  Menopausal status: postmenopausal.  HRT use: 0 years. Colonoscopy: yes; has had 2 polyps, is on a 5 year schedule because of her FH of colon cancer and colon polyps, and her polyps. Mammogram within the last year: yes. Number of breast biopsies: 0. Up to date with pelvic exams:  yes. Any excessive radiation exposure in the past:  no  Past Medical History  Diagnosis Date  . Radiculopathy 12/05/2011  . ALLERGIC RHINITIS 11/23/2007    Qualifier: Diagnosis of  By: Sherlynn Stalls, CMA, [place]    . History of colonic polyps 11/23/2007    Repeat colonoscopy advised in 2018 per GI report    . Microscopic hematuria     had work up with urologist in the past for this  . Family history of breast cancer   . Family history of colon cancer   . Family history of ovarian cancer     Past  Surgical History  Procedure Laterality Date  . Tonsillectomy    . Otoplasty    . Bone marrow extraction    . Bartholin cyst marsupialization      Social History   Social History  . Marital Status: Married    Spouse Name: N/A  . Number of Children: 2  . Years of Education: N/A   Social History Main Topics  . Smoking status: Former Smoker -- 1.00 packs/day    Types: Cigarettes    Quit date: 12/04/1996  . Smokeless tobacco: None  . Alcohol Use: 1.2 oz/week    2 Standard drinks or equivalent per week  . Drug Use: No  . Sexual Activity: Yes    Birth Control/ Protection: Condom     Comment: 1st intercourse 54 yo-More than 5 partners   Other Topics Concern  . None   Social History Narrative     FAMILY HISTORY:  We obtained a detailed, 4-generation family history.  Significant diagnoses are listed below: Family History  Problem Relation Age of Onset  . Breast cancer Mother 14  . Heart disease Mother   . Lung cancer Mother     smoker  . Breast cancer Maternal Aunt     Age 1's  . Lung cancer Maternal Aunt     smoker  . Breast cancer Maternal Grandmother  Age 31's  . Colon cancer Paternal Grandmother     dx in her 23s  . Ovarian cancer Paternal Aunt     dx in her 63s  . Leukemia Father     dx with CML in his 58s  . Colon polyps Father   . Rheumatic fever Maternal Grandfather    The patient has two children, and two brothers who do not have a history of cancer.  Her father was diagnosed with CML in his 57s and died of pneumonia.  He had one sister who had ovarian cancer in her 36s.  His mother had colon cancer in her 20s and died soon after.  The patient's mother was diagnosed with breast cancer at 74 and lung cancer (smoker) in her 43s.  She had one brother and one sister.  Her sister had breast cancer in her 19s and soon after lung cancer (smoker) and died in her 65s.  The patient's maternal grandmother was diagnosed with breast cancer in her 84s.  She had several  sisters who had daughters with breast cancer.  The patient's maternal grandfather died of a heart attack from complications of rheumatic fever.  Patient's ancestors are of Northern/Western European descent. There is no reported Ashkenazi Jewish ancestry. There is no known consanguinity.  GENETIC COUNSELING ASSESSMENT: Raizel Wesolowski is a 54 y.o. female with a family history of breast, colon and ovarian cancer which somewhat suggestive of a hereditary cancer syndrome and predisposition to cancer. We, therefore, discussed and recommended the following at today's visit.   DISCUSSION: We discussed that about 5-10% of breast cancer is the result of a hereditary cancer syndrome, with most cases due to BRCA mutations.  Other genes that commonly have mutations include PALB2, ATM and CHEK2.  Ovarian cancer is seen in about 15-20% of women with hereditary cancer syndromes.  Most commonly this is the result of BRCA mutations, but we can also see Lynch syndrome or other hereditary gene mutations.  We reviewed the characteristics, features and inheritance patterns of hereditary cancer syndromes. We also discussed genetic testing, including the appropriate family members to test, the process of testing, insurance coverage and turn-around-time for results. We discussed the implications of a negative, positive and/or variant of uncertain significant result. We recommended Ms. Beery pursue genetic testing for the Comprehensive cancer  gene panel. The Comprehensive Cancer Panel offered by [facility] includes sequencing and/or deletion duplication testing of the following 32 genes: APC, ATM, AXIN2, BARD1, BMPR1A, BRCA1, BRCA2, BRIP1, CDH1, CDK4, CDKN2A, CHEK2, EPCAM, FANCC, MLH1, MSH2, MSH6, MUTYH, NBN, PALB2, PMS2, POLD1, POLE, PTEN, RAD51C, RAD51D, SCG5/GREM1, SMAD4, STK11, TP53, VHL, and XRCC2.     Based on Ms. Cassells's family history of cancer, she meets medical criteria for genetic testing. Despite that she meets criteria,  she may still have an out of pocket cost. We discussed that if her out of pocket cost for testing is over $100, the laboratory will call and confirm whether she wants to proceed with testing.  If the out of pocket cost of testing is less than $100 she will be billed by the genetic testing laboratory.   In order to estimate her chance of having a BRCA mutation, we used statistical models (Tyrer [place], [place] and Myriad Risk Calculator) and laboratory data that take into account her personal medical history, family history and ancestry.  Because each model is different, there can be a lot of variability in the risks they give.  Therefore, these numbers must be considered a  rough range and not a precise risk of having a BRCA mutation.  These models estimate that she has approximately a 0.75-3% chance of having a mutation. Based on this assessment of her family and personal history, genetic testing is recommended.  Based on the patient's personal and family history, statistical models (Tyrer Cusik and [employer])  and literature data were used to estimate her risk of developing breast cancer. These estimate her lifetime risk of developing breast cancer to be approximately 17.6% to 29.7%. This estimation does not take into account any genetic testing results. The lower risk is due to the [place], which does not take into consideration the patient's maternal aunt and grandmother with breast cancer. The patient's lifetime breast cancer risk is a preliminary estimate based on available information using one of several models endorsed by the [place] (ACS). The ACS recommends consideration of breast MRI screening as an adjunct to mammography for patients at high risk (defined as 20% or greater lifetime risk). A more detailed breast cancer risk assessment can be considered, if clinically indicated.   Ms. Whan has been determined to be at high risk for breast cancer.  Therefore, we recommend that  annual screening with mammography and breast MRI begin at age 73, or 10 years prior to the age of breast cancer diagnosis in a relative (whichever is earlier).  We discussed that Ms. Wanner should discuss her individual situation with her referring physician and determine a breast cancer screening plan with which they are both comfortable.    PLAN: After considering the risks, benefits, and limitations, Ms. Ihrig  provided informed consent to pursue genetic testing and the blood sample was sent to [facility] for analysis of the Comprehensive Cancer Panel. Results should be available within approximately 2-3 weeks' time, at which point they will be disclosed by telephone to Ms. Hibner, as will any additional recommendations warranted by these results. Ms. Gronau will receive a summary of her genetic counseling visit and a copy of her results once available. This information will also be available in Epic. We encouraged Ms. Basista to remain in contact with cancer genetics annually so that we can continuously update the family history and inform her of any changes in cancer genetics and testing that may be of benefit for her family. Ms. Apsey questions were answered to her satisfaction today. Our contact information was provided should additional questions or concerns arise.  Based on Ms. Prospero's family history, we recommended her mother, who was diagnosed with breast cancer at age 65, have genetic counseling and testing. Ms. Spradlin will let us know if we can be of any assistance in coordinating genetic counseling and/or testing for this family member.   Lastly, we encouraged Ms. Lucchese to remain in contact with cancer genetics annually so that we can continuously update the family history and inform her of any changes in cancer genetics and testing that may be of benefit for this family.   Ms.  Perret questions were answered to her satisfaction today. Our contact information was provided should  additional questions or concerns arise. Thank you for the referral and allowing [place] to share in the care of your patient.   Karen P. Florene Glen, [place], [facility] Certified Genetic Counselor [email or web address] phone: [phone]  The patient was seen for a total of 60 minutes in face-to-face genetic counseling.  This patient was discussed with Drs. Magrinat, Lindi Adie and/or Burr Medico who agrees with the above.    _______________________________________________________________________ For Office Staff:  Number  of people involved in session: 1 Was an Intern/ student involved with case: no

## 2015-02-01 ENCOUNTER — Telehealth: Payer: Self-pay | Admitting: Genetic Counselor

## 2015-02-01 ENCOUNTER — Encounter: Payer: Self-pay | Admitting: Genetic Counselor

## 2015-02-01 DIAGNOSIS — Z1379 Encounter for other screening for genetic and chromosomal anomalies: Secondary | ICD-10-CM | POA: Insufficient documentation

## 2015-02-01 NOTE — Telephone Encounter (Signed)
Revealed that a pathogenic mutation in PALB2 was found on the Comprehensive cancer panel.  Patient is scheduled for Monday, 12/19 at 3 PM for discussion of results.

## 2015-02-05 ENCOUNTER — Ambulatory Visit: Payer: BLUE CROSS/BLUE SHIELD | Admitting: Genetic Counselor

## 2015-02-05 ENCOUNTER — Encounter: Payer: Self-pay | Admitting: Genetic Counselor

## 2015-02-05 DIAGNOSIS — Z1379 Encounter for other screening for genetic and chromosomal anomalies: Secondary | ICD-10-CM

## 2015-02-05 DIAGNOSIS — Z8041 Family history of malignant neoplasm of ovary: Secondary | ICD-10-CM

## 2015-02-05 DIAGNOSIS — Z1501 Genetic susceptibility to malignant neoplasm of breast: Secondary | ICD-10-CM

## 2015-02-05 DIAGNOSIS — Z800 Family history of malignant neoplasm of digestive organs: Secondary | ICD-10-CM

## 2015-02-05 DIAGNOSIS — Z803 Family history of malignant neoplasm of breast: Secondary | ICD-10-CM

## 2015-02-05 DIAGNOSIS — Z86010 Personal history of colonic polyps: Secondary | ICD-10-CM

## 2015-02-05 DIAGNOSIS — Z1509 Genetic susceptibility to other malignant neoplasm: Secondary | ICD-10-CM

## 2015-02-05 DIAGNOSIS — Z1589 Genetic susceptibility to other disease: Secondary | ICD-10-CM

## 2015-02-05 NOTE — Progress Notes (Signed)
REFERRING PROVIDER: Lucretia Kern, DO [place], [place] [address]   Donalynn Furlong, MD  PRIMARY PROVIDER:  Lucretia Kern., DO  PRIMARY REASON FOR VISIT:  1. Family history of breast cancer   2. Family history of ovarian cancer   3. Family history of colon cancer   4. History of colonic polyps   5. Monoallelic mutation of PALB2 gene   6. Genetic testing      HISTORY OF PRESENT ILLNESS:   Ms. Loyd, a 54 y.o. female, was seen for a [place] cancer genetics consultation to review her genetic test results which found a pathogenic mutation in PALB2 called [id number].  Ms. Ripoll presents to clinic today to discuss the predisposition to cancer, and to further clarify her future cancer risks, as well as potential cancer risks for family members.  Ms. Cortopassi is a 54 y.o. female with no personal history of cancer.    CANCER HISTORY:   No history exists.       Past Medical History  Diagnosis Date  . Radiculopathy 12/05/2011  . ALLERGIC RHINITIS 11/23/2007    Qualifier: Diagnosis of  By: Sherlynn Stalls, CMA, [place]    . History of colonic polyps 11/23/2007    Repeat colonoscopy advised in 2018 per GI report    . Microscopic hematuria     had work up with urologist in the past for this  . Family history of breast cancer   . Family history of colon cancer   . Family history of ovarian cancer     Past Surgical History  Procedure Laterality Date  . Tonsillectomy    . Otoplasty    . Bone marrow extraction    . Bartholin cyst marsupialization      Social History   Social History  . Marital Status: Married    Spouse Name: N/A  . Number of Children: 2  . Years of Education: N/A   Social History Main Topics  . Smoking status: Former Smoker -- 1.00 packs/day    Types: Cigarettes    Quit date: 12/04/1996  . Smokeless tobacco: None  . Alcohol Use: 1.2 oz/week    2 Standard drinks or equivalent per week  . Drug Use: No  . Sexual Activity: Yes    Birth Control/  Protection: Condom     Comment: 1st intercourse 54 yo-More than 5 partners   Other Topics Concern  . None   Social History Narrative     FAMILY HISTORY:  We obtained a detailed, 4-generation family history.  Significant diagnoses are listed below: Family History  Problem Relation Age of Onset  . Breast cancer Mother 75  . Heart disease Mother   . Lung cancer Mother     smoker  . Breast cancer Maternal Aunt 52  . Lung cancer Maternal Aunt     smoker  . Breast cancer Maternal Grandmother 28  . Colon cancer Paternal Grandmother     dx in her 44s  . Ovarian cancer Paternal Aunt     dx in her 75s  . Leukemia Father     dx with CML in his 46s  . Colon polyps Father   . Rheumatic fever Maternal Grandfather     The patient has two children, and two brothers who do not have a history of cancer. Her father was diagnosed with CML in his 53s and died of pneumonia. He had one sister who had ovarian cancer in her 19s. His mother had colon cancer in  her 84s and died soon after. The patient's mother was diagnosed with breast cancer at 17 and lung cancer (smoker) in her 32s. She had one brother and one sister. Her sister had breast cancer at age 50 and soon after lung cancer (smoker) and died in her 4s. The patient's maternal grandmother was diagnosed with breast cancer in her 78s. She had several sisters who had daughters with breast cancer. The patient's maternal grandfather died of a heart attack from complications of rheumatic fever. Patient's ancestors are of Northern/Western European descent. There is no reported Ashkenazi Jewish ancestry. There is no known consanguinity.  GENETIC COUNSELING ASSESSMENT: Ambur Province is a 54 y.o. female with a familial mutation in PALB2 which is somewhat suggestive of a hereditary cancer syndrome and predisposition to cancer. We, therefore, discussed and recommended the following at today's visit.   DISCUSSION: We reviewed autosomal dominant  inheritance, natural history of hereditary breast/pancreatic cancer syndrome, and current NCCN medical management guidelines for individuals with an increased risk for breast cancer.  The patient was provided with a packet of support resources for individuals with a hereditary cancer syndrome, including information about the [facility] cancer registry, and BRCA Snap App, to help keep track of screening appointments.  The PALB2 gene provides instructions for making a protein called "Partner And Localizer of BRCA2". As its name suggests, this protein interacts with the protein produced from the BRCA2 gene. These two proteins work together to mend broken strands of DNA, which prevents cells from accumulating genetic damage that can trigger them to divide uncontrollably. Because the PALB2 and BRCA2 proteins help control the rate of cell growth and division, they are described as tumor suppressors.  The PALB2 protein stabilizes the BRCA2 protein which allows the BRCA2 protein to fix damaged DNA. Breaks in DNA can be caused by natural and medical radiation or other environmental exposures.  DNA breaks can also occur when chromosomes exchange genetic material in preparation for cell division. By helping repair mistakes in DNA, the PALB2 and BRCA2 proteins play a critical role in maintaining the stability of a cell's genetic information.  Homozygous PALB2 mutation carriers (individuals with 2 PALB2 gene mutations) have a condition called Fanconi Anemia, type N. Fanconi anemia type N increases the risk of several childhood cancers, including a rare form of kidney cancer called Wilms' tumor and a brain tumor called medulloblastoma. Additionally, people with Fanconi anemia experience bone marrow suppression, which causes an abnormal reduction in the number of red blood cells, white blood cells, and blood platelets made by the bone marrow. The reduced production of red blood cells causes the anemia characteristic of this  disorder.  Studies of these families demonstrated increased incident of breast cancer in the mothers (who are heterozygous carriers) of the affected children, thus prompting further evaluation of the relationship between breast cancer and PALB2.  Women who are heterozygous PALB2 carriers have an increase breast cancer risk.  They have up to a 5-fold increased risk of breast cancer over their lifetime.  In families with familial breast cancer that were negative for BRCA1 or BRCA2 genes, approximately 2.7% of women were found to have one PALB2 mutation. PALB2 mutations are the second most common cause of hereditary pancreatic cancer, after BRCA2 mutations.  MEDICAL MANAGEMENT: Women who have a BRCA mutation have an increased risk for both breast and ovarian cancer.   As discussed with Ms. Truax, to reduce the risk for breast cancer, prophylactic bilateral mastectomy is the most effective option. However, for women  who choose to keep their breasts, we recommend yearly mammograms, yearly breast MRI, twice-yearly clinical breast exams through a high-risk clinic, and monthly self-breast exams.   FAMILY MEMBERS: It is important that all of Ms. Haugen's relatives (both men and women) know of the presence of this gene mutation. Site-specific genetic testing can sort out who in the family is at risk and who is not.   Ms. Monterrosa children and siblings have a 50% chance to have inherited this mutation. We recommend they have genetic testing for this same mutation, as identifying the presence of this mutation would allow them to also take advantage of risk-reducing measures.   PLAN: We will refer Ms. Miyamoto to the high risk clinic here at the cancer center. She will need a referral to the cancer center and we will notify her referring provider.  Based on Ms. Clemenson's family history, we recommended her mother, who was diagnosed with breast cancer, have genetic counseling and testing. Ms. Kington will let us know if  we can be of any assistance in coordinating genetic counseling and/or testing for this family member.   Lastly, we encouraged Ms. Hapke to remain in contact with cancer genetics annually so that we can continuously update the family history and inform her of any changes in cancer genetics and testing that may be of benefit for this family.   Ms.  Heinrich questions were answered to her satisfaction today. Our contact information was provided should additional questions or concerns arise. Thank you for the referral and allowing [place] to share in the care of your patient.   Karen P. Florene Glen, [place], [facility] Certified Genetic Counselor [email or web address] phone: [phone]  The patient was seen for a total of 30 minutes in face-to-face genetic counseling.  This patient was discussed with Drs. Magrinat, Lindi Adie and/or Burr Medico who agrees with the above.    _______________________________________________________________________ For Office Staff:  Number of people involved in session: 1 Was an Intern/ student involved with case: no

## 2015-02-06 ENCOUNTER — Encounter: Payer: Self-pay | Admitting: Gynecology

## 2015-02-08 ENCOUNTER — Telehealth: Payer: Self-pay | Admitting: Hematology and Oncology

## 2015-02-08 ENCOUNTER — Telehealth: Payer: Self-pay | Admitting: *Deleted

## 2015-02-08 NOTE — Telephone Encounter (Signed)
Appointment on 02/26/15 @ 1:00pm [facility]. Pt aware.

## 2015-02-08 NOTE — Telephone Encounter (Signed)
[facility] and gave appt for 01/09 @ 1 w/Dr. Pamelia HoitGudena

## 2015-02-08 NOTE — Telephone Encounter (Signed)
-----  Message from Anastasio Auerbach, MD sent at 02/06/2015 11:20 AM EST ----- Patient will need a referral to the high risk breast clinic as recommended by genetic counselor reference: positive PALB2 genetic test.

## 2015-02-18 DIAGNOSIS — Z1509 Genetic susceptibility to other malignant neoplasm: Secondary | ICD-10-CM

## 2015-02-18 DIAGNOSIS — Z1589 Genetic susceptibility to other disease: Secondary | ICD-10-CM

## 2015-02-18 DIAGNOSIS — Z1501 Genetic susceptibility to malignant neoplasm of breast: Secondary | ICD-10-CM

## 2015-02-18 HISTORY — DX: Genetic susceptibility to malignant neoplasm of breast: Z15.89

## 2015-02-18 HISTORY — DX: Genetic susceptibility to other malignant neoplasm: Z15.09

## 2015-02-18 HISTORY — DX: Genetic susceptibility to malignant neoplasm of breast: Z15.01

## 2015-02-26 ENCOUNTER — Encounter: Payer: Self-pay | Admitting: Hematology and Oncology

## 2015-02-26 ENCOUNTER — Ambulatory Visit (HOSPITAL_BASED_OUTPATIENT_CLINIC_OR_DEPARTMENT_OTHER): Payer: BLUE CROSS/BLUE SHIELD | Admitting: Hematology and Oncology

## 2015-02-26 ENCOUNTER — Telehealth: Payer: Self-pay | Admitting: Hematology and Oncology

## 2015-02-26 VITALS — BP 119/65 | HR 55 | Temp 97.4°F | Resp 18 | Wt 181.7 lb

## 2015-02-26 DIAGNOSIS — Z1589 Genetic susceptibility to other disease: Secondary | ICD-10-CM | POA: Diagnosis not present

## 2015-02-26 DIAGNOSIS — Z1501 Genetic susceptibility to malignant neoplasm of breast: Secondary | ICD-10-CM | POA: Diagnosis not present

## 2015-02-26 DIAGNOSIS — Z1509 Genetic susceptibility to other malignant neoplasm: Principal | ICD-10-CM

## 2015-02-26 NOTE — Assessment & Plan Note (Signed)
PALB2 (partner and localizer of BRCA2):  It is a gene that functions in DNA double strand brake repair similar to BRCA gene. It encodes a protein that functions in genome maintenance.  Clinical significance: 1.  Risk of breast cancer:The risk of breast cancer for female PALB2 mutation carriers, as compared with the general population, with 8-9 times as high among those younger than 55 years of age, 6-8 times as high among those 10-38 years of age, and 5 times as high among those older than 55 years of age. The cumulative breast cancer risk among female PALB2 mutation carriers is estimated to be increased by 2-4 fold by age 45, which translates to an 18-35% risk of breast cancer by age 42 based on a general female population risk of 8.8% to age 33.  2.  Risk of pancreatic cancer: The exact cumulative pancreatic cancer risk among PALB2 mutation carriers has not been determined. The magnitude of risk increases with the number of affected relatives, with the highest risk (32-fold) in individuals with three affected first-degree relatives.  3. Risk of Ovarian cancer:  Also not properly documented how much the increased risk is.  Recommendations: 1.  Consideration for prophylactic bilateral mastectomy vs annual Mammogram and breast MRI 2.  Monitoring of symptoms with pancreatic cancer. There is no clear role of routine scans are CA 19-9 for surveillance 3.  Gynecologist evaluation and counseling regarding risk of ovarian cancer.  Men with PALB2  Mutations moderate risk for breast cancer and prostate cancer as well.

## 2015-02-26 NOTE — Addendum Note (Signed)
Addended by: Lorri FrederickFRANKLIN, Kahlea Cobert K on: 02/26/2015 05:09 PM   Modules accepted: Medications

## 2015-02-26 NOTE — Progress Notes (Signed)
[facility] NOTE  Patient Care Team: Lucretia Kern, DO as PCP - General (Family Medicine)  CHIEF COMPLAINTS/PURPOSE OF CONSULTATION:  Newly diagnosed PALB2 mutation  HISTORY OF PRESENTING ILLNESS:  Kristina Pham 55 y.o. female is here because of recent diagnosis of PALB2 mutation. The patient had extensive family history of breast cancer in the mother, aunt and her grandmother. She was recommended by her gynecologist to get genetic counseling and genetic testing. She underwent genetic counseling and testing which revealed PALB2 [id number] mutation as well as VUS of APC gene. She was referred was for discussion regarding cancer risk reduction.  I reviewed her records extensively and collaborated the history with the patient.  MEDICAL HISTORY:  Past Medical History  Diagnosis Date  . Radiculopathy 12/05/2011  . ALLERGIC RHINITIS 11/23/2007    Qualifier: Diagnosis of  By: Sherlynn Stalls, CMA, [place]    . History of colonic polyps 11/23/2007    Repeat colonoscopy advised in 2018 per GI report    . Microscopic hematuria     had work up with urologist in the past for this  . Family history of breast cancer   . Family history of colon cancer   . Family history of ovarian cancer     SURGICAL HISTORY: Past Surgical History  Procedure Laterality Date  . Tonsillectomy    . Otoplasty    . Bone marrow extraction    . Bartholin cyst marsupialization      SOCIAL HISTORY: Social History   Social History  . Marital Status: Married    Spouse Name: N/A  . Number of Children: 2  . Years of Education: N/A   Occupational History  . Not on file.   Social History Main Topics  . Smoking status: Former Smoker -- 1.00 packs/day    Types: Cigarettes    Quit date: 12/04/1996  . Smokeless tobacco: Not on file  . Alcohol Use: 1.2 oz/week    2 Standard drinks or equivalent per week  . Drug Use: No  . Sexual Activity: Yes    Birth Control/ Protection: Condom     Comment: 1st  intercourse 55 yo-More than 5 partners   Other Topics Concern  . Not on file   Social History Narrative    FAMILY HISTORY: Family History  Problem Relation Age of Onset  . Breast cancer Mother 55  . Heart disease Mother   . Lung cancer Mother     smoker  . Breast cancer Maternal Aunt 52  . Lung cancer Maternal Aunt     smoker  . Breast cancer Maternal Grandmother 17  . Colon cancer Paternal Grandmother     dx in her 5s  . Ovarian cancer Paternal Aunt     dx in her 75s  . Leukemia Father     dx with CML in his 74s  . Colon polyps Father   . Rheumatic fever Maternal Grandfather     ALLERGIES:  is allergic to penicillins.  MEDICATIONS:  Current Outpatient Prescriptions  Medication Sig Dispense Refill  . Cholecalciferol (VITAMIN D PO) Take by mouth.    . fluticasone (FLONASE) 50 MCG/ACT nasal spray Place 2 sprays into both nostrils daily. 16 g 6  . Omega-3 Fatty Acids (FISH OIL PO) Take by mouth.     No current facility-administered medications for this visit.    REVIEW OF SYSTEMS:   Constitutional: Denies fevers, chills or abnormal night sweats Eyes: Denies blurriness of vision, double vision or watery eyes Ears,  nose, mouth, throat, and face: Denies mucositis or sore throat Respiratory: Denies cough, dyspnea or wheezes Cardiovascular: Denies palpitation, chest discomfort or lower extremity swelling Gastrointestinal:  Denies nausea, heartburn or change in bowel habits Skin: Denies abnormal skin rashes Lymphatics: Denies new lymphadenopathy or easy bruising Neurological:Denies numbness, tingling or new weaknesses Behavioral/Psych: Mood is stable, no new changes  Breast:  Denies any palpable lumps or discharge All other systems were reviewed with the patient and are negative.  PHYSICAL EXAMINATION: ECOG PERFORMANCE STATUS: 0 - Asymptomatic  Filed Vitals:   02/26/15 1255  BP: 119/65  Pulse: 55  Temp: 97.4 F (36.3 C)  Resp: 18   Filed Weights   02/26/15  1255  Weight: 181 lb 11.2 oz (82.419 kg)    GENERAL:alert, no distress and comfortable SKIN: skin color, texture, turgor are normal, no rashes or significant lesions EYES: normal, conjunctiva are pink and non-injected, sclera clear OROPHARYNX:no exudate, no erythema and lips, buccal mucosa, and tongue normal  NECK: supple, thyroid normal size, non-tender, without nodularity LYMPH:  no palpable lymphadenopathy in the cervical, axillary or inguinal LUNGS: clear to auscultation and percussion with normal breathing effort HEART: regular rate & rhythm and no murmurs and no lower extremity edema ABDOMEN:abdomen soft, non-tender and normal bowel sounds Musculoskeletal:no cyanosis of digits and no clubbing  PSYCH: alert & oriented x 3 with fluent speech NEURO: no focal motor/sensory deficits  LABORATORY DATA:  I have reviewed the data as listed Lab Results  Component Value Date   WBC 4.7 01/09/2014   HGB 14.3 01/09/2014   HCT 43.7 01/09/2014   MCV 97.7 01/09/2014   PLT 181.0 01/09/2014   Lab Results  Component Value Date   NA 140 01/09/2014   K 4.5 01/09/2014   CL 105 01/09/2014   CO2 25 01/09/2014    RADIOGRAPHIC STUDIES: I have personally reviewed the radiological reports and agreed with the findings in the report.  ASSESSMENT AND PLAN:  Monoallelic mutation of PALB2 gene PALB2 (partner and localizer of BRCA2):  It is a gene that functions in DNA double strand brake repair similar to BRCA gene. It encodes a protein that functions in genome maintenance.  Clinical significance: 1.  Risk of breast cancer:The risk of breast cancer for female PALB2 mutation carriers, as compared with the general population, with 8-9 times as high among those younger than 55 years of age, 6-8 times as high among those 74-99 years of age, and 5 times as high among those older than 55 years of age. The cumulative breast cancer risk among female PALB2 mutation carriers is estimated to be increased by 2-4  fold by age 7, which translates to an 18-35% risk of breast cancer by age 10 based on a general female population risk of 8.8% to age 63.  2.  Risk of pancreatic cancer: The exact cumulative pancreatic cancer risk among PALB2 mutation carriers has not been determined. The magnitude of risk increases with the number of affected relatives, with the highest risk (32-fold) in individuals with three affected first-degree relatives.  3. Risk of Ovarian cancer:  Also not properly documented how much the increased risk is.  Recommendations: 1.  Discussed the role for prophylactic bilateral mastectomy vs annual Mammogram and breast MRI. Patient decided to have annual mammograms and MRIs. I recommended that she get MRI 6 months from the mammogram time. We will order for a breast MRI to be done the next couple of weeks. 2.  Monitoring of symptoms with pancreatic cancer. There  is no clear role of routine scans are CA 19-9 for surveillance 3.  Gynecologist evaluation and counseling regarding risk of ovarian cancer.  Men with PALB2  Mutations moderate risk for breast cancer and prostate cancer as well. Patient's son and daughter will both be tested for this mutation along with her mother and aunt.  All questions were answered. The patient knows to call the clinic with any problems, questions or concerns.    Rulon Eisenmenger, MD 02/26/2015

## 2015-02-26 NOTE — Telephone Encounter (Signed)
Scheduled patient appt per pof, avs report printed. Contact information given to schedule MRI [phone]

## 2015-03-01 ENCOUNTER — Other Ambulatory Visit: Payer: Self-pay | Admitting: Hematology and Oncology

## 2015-03-01 DIAGNOSIS — Z1231 Encounter for screening mammogram for malignant neoplasm of breast: Secondary | ICD-10-CM

## 2015-03-09 ENCOUNTER — Ambulatory Visit
Admission: RE | Admit: 2015-03-09 | Discharge: 2015-03-09 | Disposition: A | Discharge status: Home / Self Care | Payer: BLUE CROSS/BLUE SHIELD | Source: Ambulatory Visit | Attending: Hematology and Oncology | Admitting: Hematology and Oncology

## 2015-03-09 DIAGNOSIS — Z1231 Encounter for screening mammogram for malignant neoplasm of breast: Secondary | ICD-10-CM

## 2015-03-14 ENCOUNTER — Ambulatory Visit
Admission: RE | Admit: 2015-03-14 | Discharge: 2015-03-14 | Disposition: A | Discharge status: Home / Self Care | Payer: BLUE CROSS/BLUE SHIELD | Source: Ambulatory Visit | Attending: Hematology and Oncology | Admitting: Hematology and Oncology

## 2015-03-14 DIAGNOSIS — Z1509 Genetic susceptibility to other malignant neoplasm: Principal | ICD-10-CM

## 2015-03-14 DIAGNOSIS — Z1501 Genetic susceptibility to malignant neoplasm of breast: Secondary | ICD-10-CM

## 2015-03-14 DIAGNOSIS — Z1589 Genetic susceptibility to other disease: Principal | ICD-10-CM

## 2015-03-14 MED ORDER — GADOBENATE DIMEGLUMINE 529 MG/ML IV SOLN
17.0000 mL | Freq: Once | INTRAVENOUS | Status: AC | PRN
  Administered 2015-03-14: 17 mL via INTRAVENOUS

## 2015-08-07 DIAGNOSIS — Z0100 Encounter for examination of eyes and vision without abnormal findings: Secondary | ICD-10-CM | POA: Diagnosis not present

## 2015-08-27 ENCOUNTER — Ambulatory Visit: Payer: BLUE CROSS/BLUE SHIELD | Admitting: Hematology and Oncology

## 2015-09-26 DIAGNOSIS — L259 Unspecified contact dermatitis, unspecified cause: Secondary | ICD-10-CM | POA: Diagnosis not present

## 2015-10-08 ENCOUNTER — Encounter: Payer: Self-pay | Admitting: Hematology and Oncology

## 2015-10-08 ENCOUNTER — Telehealth: Payer: Self-pay | Admitting: Hematology and Oncology

## 2015-10-08 ENCOUNTER — Ambulatory Visit (HOSPITAL_BASED_OUTPATIENT_CLINIC_OR_DEPARTMENT_OTHER): Payer: BLUE CROSS/BLUE SHIELD | Admitting: Hematology and Oncology

## 2015-10-08 DIAGNOSIS — Z1501 Genetic susceptibility to malignant neoplasm of breast: Secondary | ICD-10-CM | POA: Diagnosis not present

## 2015-10-08 DIAGNOSIS — Z1509 Genetic susceptibility to other malignant neoplasm: Principal | ICD-10-CM

## 2015-10-08 DIAGNOSIS — Z1589 Genetic susceptibility to other disease: Secondary | ICD-10-CM

## 2015-10-08 NOTE — Progress Notes (Signed)
Patient Care Team: Lucretia Kern, DO as PCP - General (Family Medicine)  DIAGNOSIS:PALB2 mutation  CHIEF COMPLIANT: Surveillance for breast cancer  INTERVAL HISTORY: Kristina Pham is a 55 year old with above-mentioned history of PALB2 mutation who is here for surveillance evaluation for high-risk breast cancer. She denies any lumps or nodules in the breasts. Breast MRI and mammogram done in February 2017 were normal.  REVIEW OF SYSTEMS:   Constitutional: Denies fevers, chills or abnormal weight loss Eyes: Denies blurriness of vision Ears, nose, mouth, throat, and face: Denies mucositis or sore throat Respiratory: Denies cough, dyspnea or wheezes Cardiovascular: Denies palpitation, chest discomfort Gastrointestinal:  Denies nausea, heartburn or change in bowel habits Skin: Denies abnormal skin rashes Lymphatics: Denies new lymphadenopathy or easy bruising Neurological:Denies numbness, tingling or new weaknesses Behavioral/Psych: Mood is stable, no new changes  Extremities: No lower extremity edema Breast:  denies any pain or lumps or nodules in either breasts All other systems were reviewed with the patient and are negative.  I have reviewed the past medical history, past surgical history, social history and family history with the patient and they are unchanged from previous note.  ALLERGIES:  is allergic to penicillins.  MEDICATIONS:  Current Outpatient Prescriptions  Medication Sig Dispense Refill  . Cholecalciferol (VITAMIN D PO) Take by mouth.    . fluticasone (FLONASE) 50 MCG/ACT nasal spray Place 2 sprays into both nostrils daily. 16 g 6  . Omega-3 Fatty Acids (FISH OIL PO) Take by mouth.     No current facility-administered medications for this visit.     PHYSICAL EXAMINATION: ECOG PERFORMANCE STATUS: 0 - Asymptomatic  Vitals:   10/08/15 0916  BP: 109/68  Pulse: (!) 59  Resp: 18  Temp: 98.8 F (37.1 C)   Filed Weights   10/08/15 0916  Weight: 183 lb 11.2  oz (83.3 kg)    GENERAL:alert, no distress and comfortable SKIN: skin color, texture, turgor are normal, no rashes or significant lesions EYES: normal, Conjunctiva are pink and non-injected, sclera clear OROPHARYNX:no exudate, no erythema and lips, buccal mucosa, and tongue normal  NECK: supple, thyroid normal size, non-tender, without nodularity LYMPH:  no palpable lymphadenopathy in the cervical, axillary or inguinal LUNGS: clear to auscultation and percussion with normal breathing effort HEART: regular rate & rhythm and no murmurs and no lower extremity edema ABDOMEN:abdomen soft, non-tender and normal bowel sounds MUSCULOSKELETAL:no cyanosis of digits and no clubbing  NEURO: alert & oriented x 3 with fluent speech, no focal motor/sensory deficits EXTREMITIES: No lower extremity edema BREAST: No palpable masses or nodules in either right or left breasts. No palpable axillary supraclavicular or infraclavicular adenopathy no breast tenderness or nipple discharge. (exam performed in the presence of a chaperone)  LABORATORY DATA:  I have reviewed the data as listed   Chemistry      Component Value Date/Time   NA 140 01/09/2014 0853   K 4.5 01/09/2014 0853   CL 105 01/09/2014 0853   CO2 25 01/09/2014 0853   BUN 20 01/09/2014 0853   CREATININE 0.9 01/09/2014 0853      Component Value Date/Time   CALCIUM 9.4 01/09/2014 0853   ALKPHOS 59 01/09/2014 0853   AST 21 01/09/2014 0853   ALT 22 01/09/2014 0853   BILITOT 0.5 01/09/2014 0853       Lab Results  Component Value Date   WBC 4.7 01/09/2014   HGB 14.3 01/09/2014   HCT 43.7 01/09/2014   MCV 97.7 01/09/2014   PLT 181.0 01/09/2014  NEUTROABS 2.4 01/09/2014     ASSESSMENT & PLAN:  Monoallelic mutation of PALB2 gene PALB2 (partner and localizer of BRCA2):  It is a gene that functions in DNA double strand brake repair similar to BRCA gene. It encodes a protein that functions in genome maintenance.  Clinical  significance: 1.  Risk of breast cancer:The risk of breast cancer for female PALB2 mutation carriers, as compared with the general population, with 8-9 times as high among those younger than 55 years of age, 6-8 times as high among those 30-58 years of age, and 5 times as high among those older than 55 years of age. The cumulative breast cancer risk among female PALB2 mutation carriers is estimated to be increased by 2-4 fold by age 29, which translates to an 18-35% risk of breast cancer by age 55 based on a general female population risk of 8.8% to age 89.  2.  Risk of pancreatic cancer: The exact cumulative pancreatic cancer risk among PALB2 mutation carriers has not been determined. The magnitude of risk increases with the number of affected relatives, with the highest risk (32-fold) in individuals with three affected first-degree relatives.  3. Risk of Ovarian cancer:  Also not properly documented how much the increased risk is.  Recommendations: 1.  Discussed the role for prophylactic bilateral mastectomy vs annual Mammogram and breast MRI. Patient decided to have annual mammograms and MRIs. Breast MRI 03/14/2015: Normal Mammogram 03/09/2015: No mammographic evidence of malignancy. Patient tells me that the breast MRI was not covered by her insurance. I instructed the patient to make sure that it gets prior authorized and before she pays any bills such as the MRI she should contact us to discuss this further. Baseline NCC and guidelines patient needs breast MRI are bilateral mastectomies. Patient chose to undergo annual breast MRI surveillance. So there is no reason why her insurance should not pay for breast MRIs for surveillance.  2. Risk of pancreatic and ovarian cancers: Unfortunately there are no screening studies that are helpful. Clinical monitoring. Her mother was also diagnosed with PALB2 mutation. Her daughter is yet to be screened for this.  Return to clinic in 1 year for  follow-up and surveillance breast exams   No orders of the defined types were placed in this encounter.  The patient has a good understanding of the overall plan. she agrees with it. she will call with any problems that may develop before the next visit here.   Rulon Eisenmenger, MD 10/08/15

## 2015-10-08 NOTE — Telephone Encounter (Signed)
appt made and avs printed °

## 2015-10-08 NOTE — Assessment & Plan Note (Signed)
PALB2 (partner and localizer of BRCA2):  It is a gene that functions in DNA double strand brake repair similar to BRCA gene. It encodes a protein that functions in genome maintenance.  Clinical significance: 1.  Risk of breast cancer:The risk of breast cancer for female PALB2 mutation carriers, as compared with the general population, with 8-9 times as high among those younger than 55 years of age, 6-8 times as high among those 68-56 years of age, and 5 times as high among those older than 55 years of age. The cumulative breast cancer risk among female PALB2 mutation carriers is estimated to be increased by 2-4 fold by age 72, which translates to an 18-35% risk of breast cancer by age 70 based on a general female population risk of 8.8% to age 26.  2.  Risk of pancreatic cancer: The exact cumulative pancreatic cancer risk among PALB2 mutation carriers has not been determined. The magnitude of risk increases with the number of affected relatives, with the highest risk (32-fold) in individuals with three affected first-degree relatives.  3. Risk of Ovarian cancer:  Also not properly documented how much the increased risk is.  Recommendations: 1.  Discussed the role for prophylactic bilateral mastectomy vs annual Mammogram and breast MRI. Patient decided to have annual mammograms and MRIs. Breast MRI 03/14/2015: Normal Mammogram 03/09/2015: No mammographic evidence of malignancy.  2. Risk of pancreatic and ovarian cancers: Unfortunately there are no screening studies that are helpful. Clinical monitoring.  Return to clinic in 1 year for follow-up and surveillance breast exams

## 2015-12-18 DIAGNOSIS — K635 Polyp of colon: Secondary | ICD-10-CM | POA: Diagnosis not present

## 2015-12-18 DIAGNOSIS — Z800 Family history of malignant neoplasm of digestive organs: Secondary | ICD-10-CM | POA: Diagnosis not present

## 2015-12-18 DIAGNOSIS — Z8371 Family history of colonic polyps: Secondary | ICD-10-CM | POA: Diagnosis not present

## 2015-12-18 DIAGNOSIS — Z86010 Personal history of colonic polyps: Secondary | ICD-10-CM | POA: Diagnosis not present

## 2015-12-18 LAB — HM COLONOSCOPY

## 2015-12-20 ENCOUNTER — Encounter: Payer: Self-pay | Admitting: Family Medicine

## 2015-12-20 ENCOUNTER — Ambulatory Visit (INDEPENDENT_AMBULATORY_CARE_PROVIDER_SITE_OTHER): Payer: BLUE CROSS/BLUE SHIELD | Admitting: Family Medicine

## 2015-12-20 VITALS — BP 98/64 | HR 70 | Temp 98.0°F | Ht 66.25 in | Wt 187.2 lb

## 2015-12-20 DIAGNOSIS — Z23 Encounter for immunization: Secondary | ICD-10-CM

## 2015-12-20 DIAGNOSIS — Z0000 Encounter for general adult medical examination without abnormal findings: Secondary | ICD-10-CM

## 2015-12-20 DIAGNOSIS — Z1159 Encounter for screening for other viral diseases: Secondary | ICD-10-CM | POA: Diagnosis not present

## 2015-12-20 DIAGNOSIS — E785 Hyperlipidemia, unspecified: Secondary | ICD-10-CM

## 2015-12-20 LAB — HDL CHOLESTEROL: HDL: 62.7 mg/dL (ref >39.00)

## 2015-12-20 LAB — CHOLESTEROL, TOTAL: CHOLESTEROL: 220 mg/dL — AB (ref 0–200)

## 2015-12-20 LAB — HEMOGLOBIN A1C: Hgb A1c MFr Bld: 5.1 % (ref 4.6–6.5)

## 2015-12-20 NOTE — Progress Notes (Signed)
HPI:  Here for CPE:  -Concerns and/or follow up today:   Hyperlipidemia: -mild -treating with fish oil and lifestyle in the past   Seasonal allergies: -used flonase in the past  Increased Risk Breast/pancreatic/ovarian Ca/PALB2 mutation: -managed and monitored by onc and gyn -has chosen annual mammo/breast MRIs - managed by her oncologist  -Diet: variety of foods, balance and well rounded, has cut out meat except for fish, taking B vitamin - does eat a lot of dairy, cheese though  -Exercise: regular exercise  -Taking folic acid, vitamin D or calcium: takes vit D  -Diabetes and Dyslipidemia Screening: FASTING for labs  -Hx of HTN: no  -Vaccines: UTD, except want sto do flu shot  -pap history: sees Dr. Phineas Real for women's health exams/monitoring - last pap 12/2014  -sexual activity: yes, female partner, no new partners  -wants STI testing (Hep C if born [birth year]): no  -FH breast, colon or ovarian ca: see FH Last mammogram: see about - sees oncologist and gyn for breast and women's health management Last colon cancer screening: last week (11/2015) - she is waiting on path from one small polyp, assistant obtained report  -Alcohol, Tobacco, drug use: see social history  Review of Systems - no fevers, unintentional weight loss, vision loss, hearing loss, chest pain, sob, hemoptysis, melena, hematochezia, hematuria, genital discharge, changing or concerning skin lesions, bleeding, bruising, loc, thoughts of self harm or SI  Past Medical History:  Diagnosis Date  . ALLERGIC RHINITIS 11/23/2007   Qualifier: Diagnosis of  By: Sherlynn Stalls, CMA, [place]    . Family history of breast cancer   . Family history of colon cancer   . Family history of ovarian cancer   . History of colonic polyps 11/23/2007   Repeat colonoscopy advised in 2018 per GI report    . Microscopic hematuria    had work up with urologist in the past for this  . Radiculopathy 12/05/2011    Past Surgical History:   Procedure Laterality Date  . BARTHOLIN CYST MARSUPIALIZATION    . Bone marrow extraction    . OTOPLASTY    . TONSILLECTOMY      Family History  Problem Relation Age of Onset  . Breast cancer Mother 18  . Heart disease Mother   . Lung cancer Mother     smoker  . Breast cancer Maternal Aunt 52  . Lung cancer Maternal Aunt     smoker  . Breast cancer Maternal Grandmother 59  . Colon cancer Paternal Grandmother     dx in her 54s  . Ovarian cancer Paternal Aunt     dx in her 88s  . Leukemia Father     dx with CML in his 21s  . Colon polyps Father   . Rheumatic fever Maternal Grandfather     Social History   Social History  . Marital status: Married    Spouse name: N/A  . Number of children: 2  . Years of education: N/A   Social History Main Topics  . Smoking status: Former Smoker    Packs/day: 1.00    Types: Cigarettes    Quit date: 12/04/1996  . Smokeless tobacco: Never Used  . Alcohol use 1.2 oz/week    2 Standard drinks or equivalent per week  . Drug use: No  . Sexual activity: Yes    Birth control/ protection: Condom     Comment: 1st intercourse 55 yo-More than 5 partners   Other Topics Concern  . None  Social History Narrative  . None     Current Outpatient Prescriptions:  .  Cholecalciferol (VITAMIN D PO), Take by mouth., Disp: , Rfl:  .  fluticasone (FLONASE) 50 MCG/ACT nasal spray, Place 2 sprays into both nostrils daily., Disp: 16 g, Rfl: 6 .  Omega-3 Fatty Acids (FISH OIL PO), Take by mouth., Disp: , Rfl:  .  vitamin B-12 (CYANOCOBALAMIN) 1000 MCG tablet, Take 1,000 mcg by mouth daily., Disp: , Rfl:   EXAM:  Vitals:   12/20/15 1312  BP: 98/64  Pulse: 70  Temp: 98 F (36.7 C)    GENERAL: vitals reviewed and listed below, alert, oriented, appears well hydrated and in no acute distress  HEENT: head atraumatic, PERRLA, normal appearance of eyes, ears, nose and mouth. moist mucus membranes.  NECK: supple, no masses or  lymphadenopathy  LUNGS: clear to auscultation bilaterally, no rales, rhonchi or wheeze  CV: HRRR, no peripheral edema or cyanosis, normal pedal pulses  BREAST: declined, does with gyn and once  ABDOMEN: bowel sounds normal, soft, non tender to palpation, no masses, no rebound or guarding  GU: declined, does with gyn  SKIN: no rash or abnormal lesions  MS: normal gait, moves all extremities normally  NEURO: normal gait, speech and thought processing grossly intact, muscle tone grossly intact throughout  PSYCH: normal affect, pleasant and cooperative  ASSESSMENT AND PLAN:  Discussed the following assessment and plan:  Encounter for preventive health examination - Plan: Hemoglobin A1c  Hyperlipidemia, unspecified hyperlipidemia type - Plan: Cholesterol, total, HDL cholesterol  Need for hepatitis C screening test - Plan: Hepatitis C antibody  Encounter for immunization - Plan: Flu Vaccine QUAD 36+ mos IM  -Discussed and advised all [place] preventive services health task force level A and B recommendations for age, sex and risks.  -Advised at least 150 minutes of exercise per week and a healthy diet with avoidance of (less then 1 serving per week) processed foods, white starches, red meat, fast foods and sweets and consisting of: * 5-9 servings of fresh fruits and vegetables (not corn or potatoes) *nuts and seeds, beans *olives and olive oil *lean meats such as fish and white chicken  *whole grains  -women's health, breast/ovarian/cancer screening done with oncology and gyn  -labs, studies and vaccines per orders this encounter  Orders Placed This Encounter  Procedures  . Flu Vaccine QUAD 36+ mos IM  . Hemoglobin A1c  . Cholesterol, total  . HDL cholesterol  . Hepatitis C antibody    Patient advised to return to clinic immediately if symptoms worsen or persist or new concerns.  Patient Instructions  BEFORE YOU LEAVE: -follow up: yearly and as needed -flu  shot -labs  We have ordered labs or studies at this visit. It can take up to 1-2 weeks for results and processing. IF results require follow up or explanation, we will call you with instructions. Clinically stable results will be released to your [facility]. If you have not heard from [place] or cannot find your results in [facility] in 2 weeks please contact our office at [phone].  If you are not yet signed up for [facility], please consider signing up.   We recommend the following healthy lifestyle for LIFE: 1) Small portions.   Tip: eat off of a salad plate instead of a dinner plate.  Tip: It is ok to feel hungry after a meal if you had a proper portion size  Tip: if you need more or a snack choose fruits, veggies and/or  a handful of nuts or seeds.  2) Eat a healthy clean diet.  * Tip: Avoid (less then 1 serving per week): processed foods, sweets, sweetened drinks, white starches (rice, flour, bread, potatoes, pasta, etc), red meat, fast foods, butter  *Tip: CHOOSE instead   * 5-9 servings per day of fresh or frozen fruits and vegetables (but not corn, potatoes, bananas, canned or dried fruit)   *nuts and seeds, beans   *olives and olive oil   *small portions of lean meats such as fish and white chicken    *small portions of whole grains  3)Get at least 150 minutes of sweaty aerobic exercise per week.  4)Reduce stress - consider counseling, meditation and relaxation to balance other aspects of your life.          No Follow-up on file.  Colin Benton R., DO

## 2015-12-20 NOTE — Patient Instructions (Signed)
BEFORE YOU LEAVE: -follow up: yearly and as needed -flu shot -labs  We have ordered labs or studies at this visit. It can take up to 1-2 weeks for results and processing. IF results require follow up or explanation, we will call you with instructions. Clinically stable results will be released to your [facility]. If you have not heard from us or cannot find your results in [facility] in 2 weeks please contact our office at [phone].  If you are not yet signed up for [facility], please consider signing up.   We recommend the following healthy lifestyle for LIFE: 1) Small portions.   Tip: eat off of a salad plate instead of a dinner plate.  Tip: It is ok to feel hungry after a meal if you had a proper portion size  Tip: if you need more or a snack choose fruits, veggies and/or a handful of nuts or seeds.  2) Eat a healthy clean diet.  * Tip: Avoid (less then 1 serving per week): processed foods, sweets, sweetened drinks, white starches (rice, flour, bread, potatoes, pasta, etc), red meat, fast foods, butter  *Tip: CHOOSE instead   * 5-9 servings per day of fresh or frozen fruits and vegetables (but not corn, potatoes, bananas, canned or dried fruit)   *nuts and seeds, beans   *olives and olive oil   *small portions of lean meats such as fish and white chicken    *small portions of whole grains  3)Get at least 150 minutes of sweaty aerobic exercise per week.  4)Reduce stress - consider counseling, meditation and relaxation to balance other aspects of your life.

## 2015-12-20 NOTE — Progress Notes (Signed)
Pre visit review using our clinic review tool, if applicable. No additional management support is needed unless otherwise documented below in the visit note. 

## 2015-12-21 LAB — HEPATITIS C ANTIBODY: HCV Ab: NEGATIVE

## 2015-12-26 ENCOUNTER — Encounter: Payer: Self-pay | Admitting: Gynecology

## 2015-12-26 ENCOUNTER — Ambulatory Visit (INDEPENDENT_AMBULATORY_CARE_PROVIDER_SITE_OTHER): Payer: BLUE CROSS/BLUE SHIELD | Admitting: Gynecology

## 2015-12-26 VITALS — BP 114/70 | Ht 66.0 in | Wt 189.0 lb

## 2015-12-26 DIAGNOSIS — Z1501 Genetic susceptibility to malignant neoplasm of breast: Secondary | ICD-10-CM | POA: Diagnosis not present

## 2015-12-26 DIAGNOSIS — Z1589 Genetic susceptibility to other disease: Secondary | ICD-10-CM

## 2015-12-26 DIAGNOSIS — Z1509 Genetic susceptibility to other malignant neoplasm: Secondary | ICD-10-CM

## 2015-12-26 DIAGNOSIS — Z01419 Encounter for gynecological examination (general) (routine) without abnormal findings: Secondary | ICD-10-CM | POA: Diagnosis not present

## 2015-12-26 NOTE — Progress Notes (Signed)
    Kristina Pham 01/22/1961 [id number]        55 y.o.  G2P2002  for annual exam.  Doing well without complaints. Several issues noted below.  Past medical history,surgical history, problem list, medications, allergies, family history and social history were all reviewed and documented as reviewed in the EPIC chart.  ROS:  Performed with pertinent positives and negatives included in the history, assessment and plan.   Additional significant findings :  None   Exam: [name] assistant Vitals:   12/26/15 1012  BP: 114/70  Weight: 189 lb (85.7 kg)  Height: '5\' 6"'$  (1.676 m)   Body mass index is 30.51 kg/m.  General appearance:  Normal affect, orientation and appearance. Skin: Grossly normal HEENT: Without gross lesions.  No cervical or supraclavicular adenopathy. Thyroid normal.  Lungs:  Clear without wheezing, rales or rhonchi Cardiac: RR, without RMG Abdominal:  Soft, nontender, without masses, guarding, rebound, organomegaly or hernia Breasts:  Examined lying and sitting without masses, retractions, discharge or axillary adenopathy. Pelvic:  Ext, BUS, Vagina Normal  Cervix normal  Uterus anteverted, normal size, shape and contour, midline and mobile nontender   Adnexa without masses or tenderness    Anus and perineum normal   Rectovaginal normal sphincter tone without palpated masses or tenderness.    Assessment/Plan:  55 y.o. G71P2002 female for annual exam.  1. Postmenopausal. Without significant hot flushes, night sweats, vaginal dryness or any vaginal bleeding. Continue to monitor report any issues. 2. Strong family history of breast cancer.  Underwent genetic testing and found to be positive for PALB2 mutation. Has undergone genetic counseling as well as being followed in the high risk breast clinic. This mutation carries a significant increased risk of breast cancer. Options for prophylactic mastectomy versus increased surveillance has been reviewed with her elsewhere  and she chooses for surveillance at this time. She is undergoing annual mammography and annual MRIs. This mutation has also apparently been associated with pancreatic cancer and possible ovarian cancer but not well determined as far as the risk. Options for ovarian management was discussed to include increased surveillance such as ultrasounds and CA-125's. The studies which have not shown these surveillance methods beneficial in low risk populations discussed. Whether they would afford benefit in a higher risk patient unknown and the risk of a missed diagnoses our rapid onset of disease following normal studies was discussed. Options for prophylactic bilateral salpingo-oophorectomies discussed. What's involved with the surgery to include a laparoscopic approach reviewed. My recommendation was to proceed with laparoscopic bilateral salpingo-oophorectomies given the less well-defined risk of ovarian cancer associated with this mutation. The patient is in agreement and wants to proceed with this and we will go ahead and schedule her for this. 3. Colonoscopy 2017. Repeat at their recommended interval. 4. Pap smear 2016. No Pap smear done today. No history of significant abnormal Pap smears. 5. DEXA never. Will plan further into menopause. 6. Health maintenance. No routine lab work done as this is reportedly done elsewhere. Follow up for surgery as arranged otherwise annual exam in one year.  Additional time  in excess of her routine gynecologic exam was spent in direct face to face counseling and coordination of care in regards to her PALB2 mutation and management of her increased ovarian cancer risk.    Anastasio Auerbach MD, 10:48 AM 12/26/2015

## 2015-12-26 NOTE — Patient Instructions (Signed)
Office will call you to arrange for surgery. If you decide against this then I would recommend screening with ultrasounds and CA-125 blood test

## 2015-12-27 ENCOUNTER — Encounter: Payer: Self-pay | Admitting: Gynecology

## 2015-12-27 ENCOUNTER — Other Ambulatory Visit: Payer: Self-pay | Admitting: Gynecology

## 2015-12-27 DIAGNOSIS — Z1502 Genetic susceptibility to malignant neoplasm of ovary: Secondary | ICD-10-CM

## 2016-01-02 ENCOUNTER — Telehealth: Payer: Self-pay

## 2016-01-02 NOTE — Telephone Encounter (Signed)
Patient called stating she would like to see about getting Predetermination Of Benefits from [employer] for her upcoming Lap BSO.  I called [employer] and called her back to relay that [employer] rep stated that they do not offer Predetermination. Prior Berkley Harveyauth is not required but not a guarantee of payment. They do not offer guarantee of payment. Patient asked what I thought in regards to if they might cover. I told her that if medically indicated, and it did appear her was, I thought she would be okay. I explained to her that I am not the insurance company and they could certainly decide against it but that her chart is well documented as to why the surgery is medically indicated.  She verbalizes understanding that I cannot guarantee her ins will cover. She does want to proceed and states if they deny she will appeal it and I told her we will be happy to assist in any way we can if that happens.

## 2016-01-21 ENCOUNTER — Ambulatory Visit (INDEPENDENT_AMBULATORY_CARE_PROVIDER_SITE_OTHER): Payer: BLUE CROSS/BLUE SHIELD

## 2016-01-21 ENCOUNTER — Encounter: Payer: Self-pay | Admitting: Gynecology

## 2016-01-21 ENCOUNTER — Ambulatory Visit (INDEPENDENT_AMBULATORY_CARE_PROVIDER_SITE_OTHER): Payer: BLUE CROSS/BLUE SHIELD | Admitting: Gynecology

## 2016-01-21 ENCOUNTER — Other Ambulatory Visit: Payer: Self-pay | Admitting: Gynecology

## 2016-01-21 VITALS — BP 118/76

## 2016-01-21 DIAGNOSIS — R938 Abnormal findings on diagnostic imaging of other specified body structures: Secondary | ICD-10-CM

## 2016-01-21 DIAGNOSIS — Z1502 Genetic susceptibility to malignant neoplasm of ovary: Secondary | ICD-10-CM | POA: Diagnosis not present

## 2016-01-21 DIAGNOSIS — N840 Polyp of corpus uteri: Secondary | ICD-10-CM

## 2016-01-21 DIAGNOSIS — R9389 Abnormal findings on diagnostic imaging of other specified body structures: Secondary | ICD-10-CM

## 2016-01-21 MED ORDER — MISOPROSTOL 200 MCG PO TABS: ORAL_TABLET | ORAL | 0 refills | Status: DC

## 2016-01-21 NOTE — Patient Instructions (Signed)
follow up for surgery as scheduled 

## 2016-01-21 NOTE — H&P (Addendum)
Analaura Pemble 12/05/1960 [id number]   History and Physical  Chief complaint: Family history of breast cancer with PALB2 genetic mutation with indeterminate ovarian cancer risk. Thickened endometrial echo consistent with polyp  History of present illness: 55 y.o. G2P2002 with strong family history of breast cancer. Underwent genetic counseling and testing. Found to have a mutation PALB2 with a strong breast cancer risk. Has an indeterminate ovarian cancer risk.  Options for management to include increased surveillance such as CA-125 and ultrasound studies versus prophylactic salpingo-oophorectomy were reviewed and the patient elects for prophylactic salpingo-oophorectomy. Preoperative ultrasound shows normal-appearing postmenopausal ovaries but her endometrial echo is thickened at 11.5 mm suggesting an endometrial polyp. Options for management to include removal of the polyp first for histology and assuming negative then proceed with salpingo-oophorectomy versus doing is a combined procedure recognizing that if the histology showed significant atypia within the polyp she may substantively require hysterectomy is a separate procedure. Patient prefers to proceed with a combined procedure and will be admitted for laparoscopic bilateral salpingo-oophorectomy and hysteroscopy with resection of endometrial polyp.  Past medical history,surgical history, medications, allergies, family history and social history were all reviewed and documented in the EPIC chart.  ROS:  Was performed and pertinent positives and negatives are included in the history of present illness.  Exam:  Kim Gardner assistant 01/21/2016 Vitals:   01/21/16 1232  BP: 118/76   General: well developed, well nourished female, no acute distress HEENT: normal  Lungs: clear to auscultation without wheezing, rales or rhonchi  Cardiac: regular rate without rubs, murmurs or gallops  Abdomen: soft, nontender without masses, guarding, rebound,  organomegaly  Pelvic: external bus vagina: normal   Cervix: grossly normal  Uterus: normal size, midline and mobile, nontender  Adnexa: without masses or tenderness    Assessment/Plan:  55 y.o. G2P2002 With history as above for laparoscopic bilateral salpingo-oophorectomy and hysteroscopic resection of endometrial polyp.  The expected intraoperative and postoperative courses as well as the recovery period were reviewed. I discussed what is involved with both the hysteroscopy to include instrumentation using the hysteroscope and resectoscope as well as the laparoscopic portion to include use of the laparoscope, other instruments and multiple port sites. She understands that at any time during the procedure I may convert the laparoscopic approach to a larger incision if significant adhesions or complications arise intraoperatively and that this would require a longer recovery period with a longer incision. The risks of infection, prolonged antibiotics, reoperation for abscess or hematoma formation was discussed. The risks of hemorrhage necessitating transfusion and the risks of transfusion reaction, hepatitis, HIV, mad cow disease and other unknown entities was also discussed. Incisional complications to include opening and draining of incisions and closure by secondary intention, dehiscence and long-term issues of keloid/cosmetics and hernia formation were reviewed. The risk of inadvertent injury to internal organs including vagina, cervix, uterus, with possible perforation and damage to bowel, bladder, ureters, vessels, nerves either immediately recognized or delay recognized necessitating major exploratory reparative surgeries and future reparative surgeries including bowel resection, ostomy formation, bladder repair, ureteral damage repair was discussed with her. The patient's questions were answered to her satisfaction and she is ready to proceed with surgery.      FONTAINE,TIMOTHY P MD, 1:55 PM  01/21/2016                    

## 2016-01-21 NOTE — Progress Notes (Signed)
Kristina Pham 04-Oct-1960 [id number]   Preoperative consult  Chief complaint: Family history of breast cancer with PALB2 genetic mutation with indeterminate ovarian cancer risk. Thickened endometrial echo consistent with polyp  History of present illness: 54 y.o. [id number] with strong family history of breast cancer. Underwent genetic counseling and testing. Found to have a mutation PALB2 with a strong breast cancer risk. Has an indeterminate ovarian cancer risk.  Options for management to include increased surveillance such as CA-125 and ultrasound studies versus prophylactic salpingo-oophorectomy were reviewed and the patient elects for prophylactic salpingo-oophorectomy. Preoperative ultrasound shows normal-appearing postmenopausal ovaries but her endometrial echo is thickened at 11.5 mm suggesting an endometrial polyp. Options for management to include removal of the polyp first for histology and assuming negative then proceed with salpingo-oophorectomy versus doing is a combined procedure recognizing that if the histology showed significant atypia within the polyp she may substantively require hysterectomy is a separate procedure. Patient prefers to proceed with a combined procedure and will be admitted for laparoscopic bilateral salpingo-oophorectomy and hysteroscopy with resection of endometrial polyp.  Past medical history,surgical history, medications, allergies, family history and social history were all reviewed and documented in the EPIC chart.  ROS:  Was performed and pertinent positives and negatives are included in the history of present illness.  Exam:  [name] assistant Vitals:   01/21/16 1232  BP: 118/76   General: well developed, well nourished female, no acute distress HEENT: normal  Lungs: clear to auscultation without wheezing, rales or rhonchi  Cardiac: regular rate without rubs, murmurs or gallops  Abdomen: soft, nontender without masses, guarding, rebound, organomegaly   Pelvic: external bus vagina: normal   Cervix: grossly normal  Uterus: normal size, midline and mobile, nontender  Adnexa: without masses or tenderness    Assessment/Plan:  55 y.o. [id number] With history as above for lap postoperative bilateral salpingo-oophorectomy and hysteroscopic resection of endometrial polyp.  The expected intraoperative and postoperative courses as well as the recovery period were reviewed. I discussed what is involved with both the hysteroscopy to include instrumentation using the hysteroscope and resectoscope as well as the laparoscopic portion to include use of the laparoscope, other instruments and multiple port sites. She understands that at any time during the procedure I may convert the laparoscopic approach to a larger incision if significant adhesions or complications arise intraoperatively and that this would require a longer recovery period with a longer incision. The risks of infection, prolonged antibiotics, reoperation for abscess or hematoma formation was discussed. The risks of hemorrhage necessitating transfusion and the risks of transfusion reaction, hepatitis, HIV, mad cow disease and other unknown entities was also discussed. Incisional complications to include opening and draining of incisions and closure by secondary intention, dehiscence and long-term issues of keloid/cosmetics and hernia formation were reviewed. The risk of inadvertent injury to internal organs including vagina, cervix, uterus, with possible perforation and damage to bowel, bladder, ureters, vessels, nerves either immediately recognized or delay recognized necessitating major exploratory reparative surgeries and future reparative surgeries including bowel resection, ostomy formation, bladder repair, ureteral damage repair was discussed with her. The patient's questions were answered to her satisfaction and she is ready to proceed with surgery.    Anastasio Auerbach MD, 1:01 PM  01/21/2016

## 2016-01-22 ENCOUNTER — Other Ambulatory Visit: Payer: Self-pay | Admitting: Gynecology

## 2016-01-22 ENCOUNTER — Telehealth: Payer: Self-pay

## 2016-01-22 DIAGNOSIS — Z803 Family history of malignant neoplasm of breast: Secondary | ICD-10-CM

## 2016-01-22 NOTE — Telephone Encounter (Signed)
Patient was fine with this. Lab appt scheduled for Monday and ordered placed.

## 2016-01-22 NOTE — Telephone Encounter (Signed)
Surgery is Thursday so she can have it drawn Monday and I should have it back by Tuesday or Wednesday. But she must have it drawn Monday

## 2016-01-22 NOTE — Telephone Encounter (Signed)
Patient called back. She is happy to come get CA-125 drawn but problem is she is out of town all this week and will not be back until Sunday night.  Do you want her to come in next Monday and have it drawn before surgery on Tuesday?

## 2016-01-22 NOTE — Telephone Encounter (Signed)
Per staff message from Dr. Velvet BatheF this morning, "Ask patient to come by this week for CA 125. I thought she had one drawn before but when I was putting together her pre op paperwork I realized she did not. Apologize to her that I did not draw yesterday. Put in order and have her come by some time this week. Thanks "  I called patient and left detailed message per DPR access note.  I asked her to call me and schedule lab appt for this week. Order placed.

## 2016-01-28 ENCOUNTER — Other Ambulatory Visit: Payer: BLUE CROSS/BLUE SHIELD

## 2016-01-28 ENCOUNTER — Encounter (HOSPITAL_COMMUNITY)
Admission: RE | Admit: 2016-01-28 | Discharge: 2016-01-28 | Disposition: A | Discharge status: Home / Self Care | Payer: BLUE CROSS/BLUE SHIELD | Source: Ambulatory Visit | Attending: Gynecology | Admitting: Gynecology

## 2016-01-28 ENCOUNTER — Encounter (HOSPITAL_COMMUNITY): Payer: Self-pay

## 2016-01-28 DIAGNOSIS — Z1502 Genetic susceptibility to malignant neoplasm of ovary: Secondary | ICD-10-CM | POA: Insufficient documentation

## 2016-01-28 DIAGNOSIS — N840 Polyp of corpus uteri: Secondary | ICD-10-CM | POA: Diagnosis not present

## 2016-01-28 DIAGNOSIS — Z803 Family history of malignant neoplasm of breast: Secondary | ICD-10-CM | POA: Diagnosis not present

## 2016-01-28 DIAGNOSIS — Z87891 Personal history of nicotine dependence: Secondary | ICD-10-CM | POA: Diagnosis not present

## 2016-01-28 LAB — COMPREHENSIVE METABOLIC PANEL
ALBUMIN: 4.4 g/dL (ref 3.5–5.0)
ALK PHOS: 52 U/L (ref 38–126)
ALT: 29 U/L (ref 14–54)
ANION GAP: 5 (ref 5–15)
AST: 24 U/L (ref 15–41)
BILIRUBIN TOTAL: 0.8 mg/dL (ref 0.3–1.2)
BUN: 12 mg/dL (ref 6–20)
CALCIUM: 9.4 mg/dL (ref 8.9–10.3)
CO2: 29 mmol/L (ref 22–32)
Chloride: 106 mmol/L (ref 101–111)
Creatinine, Ser: 0.75 mg/dL (ref 0.44–1.00)
GFR calc Af Amer: >60 mL/min (ref >60)
GLUCOSE: 93 mg/dL (ref 65–99)
POTASSIUM: 4 mmol/L (ref 3.5–5.1)
Sodium: 140 mmol/L (ref 135–145)
TOTAL PROTEIN: 6.7 g/dL (ref 6.5–8.1)

## 2016-01-28 LAB — CBC
HCT: 41.5 % (ref 36.0–46.0)
HEMOGLOBIN: 13.8 g/dL (ref 12.0–15.0)
MCH: 31.5 pg (ref 26.0–34.0)
MCHC: 33.3 g/dL (ref 30.0–36.0)
MCV: 94.7 fL (ref 78.0–100.0)
Platelets: 195 10*3/uL (ref 150–400)
RBC: 4.38 MIL/uL (ref 3.87–5.11)
RDW: 12.9 % (ref 11.5–15.5)
WBC: 5.2 10*3/uL (ref 4.0–10.5)

## 2016-01-28 NOTE — Patient Instructions (Addendum)
Your procedure is scheduled on:  Thursday, January 31, 2016  [facility] at: 7:30 am  Pick up the phone at the desk and dial [phone].  Call this number if you have problems the morning of surgery: [phone].  Remember: Do NOT eat food or drink any liquids: after Midnight on Wednesday December 13 Take these medicines the morning of surgery with a SIP OF WATER:  STOP FISH OIL AND ANY SUPPLEMENTS TODAY  Do NOT wear jewelry (body piercing), metal hair clips/bobby pins, make-up, or nail polish. Do NOT wear lotions, powders, or perfumes.  You may wear deoderant. Do NOT shave for 48 hours prior to surgery. Do NOT bring valuables to the hospital. Contacts, dentures, or bridgework may not be worn into surgery. . Have a responsible adult drive you home and stay with you for 24 hours after your procedure

## 2016-01-29 LAB — CA 125: CA 125: 9 U/mL (ref <35)

## 2016-01-30 MED ORDER — METRONIDAZOLE IN NACL 5-0.79 MG/ML-% IV SOLN
500.0000 mg | INTRAVENOUS | Status: AC
  Administered 2016-01-31: 500 mg via INTRAVENOUS
  Filled 2016-01-30: qty 100

## 2016-01-30 MED ORDER — CIPROFLOXACIN IN D5W 400 MG/200ML IV SOLN
400.0000 mg | INTRAVENOUS | Status: AC
  Administered 2016-01-31: 400 mg via INTRAVENOUS
  Filled 2016-01-30: qty 200

## 2016-01-31 ENCOUNTER — Encounter (HOSPITAL_COMMUNITY): Payer: Self-pay | Admitting: *Deleted

## 2016-01-31 ENCOUNTER — Encounter (HOSPITAL_COMMUNITY)
Admission: RE | Disposition: A | Discharge status: Home / Self Care | Payer: Self-pay | Source: Ambulatory Visit | Attending: Gynecology

## 2016-01-31 ENCOUNTER — Ambulatory Visit (HOSPITAL_COMMUNITY): Payer: BLUE CROSS/BLUE SHIELD | Admitting: Anesthesiology

## 2016-01-31 ENCOUNTER — Ambulatory Visit (HOSPITAL_COMMUNITY)
Admission: RE | Admit: 2016-01-31 | Discharge: 2016-01-31 | Disposition: A | Discharge status: Home / Self Care | Payer: BLUE CROSS/BLUE SHIELD | Source: Ambulatory Visit | Attending: Gynecology | Admitting: Gynecology

## 2016-01-31 DIAGNOSIS — Z1501 Genetic susceptibility to malignant neoplasm of breast: Secondary | ICD-10-CM | POA: Diagnosis not present

## 2016-01-31 DIAGNOSIS — N801 Endometriosis of ovary: Secondary | ICD-10-CM | POA: Diagnosis not present

## 2016-01-31 DIAGNOSIS — Z87891 Personal history of nicotine dependence: Secondary | ICD-10-CM | POA: Diagnosis not present

## 2016-01-31 DIAGNOSIS — N840 Polyp of corpus uteri: Secondary | ICD-10-CM | POA: Insufficient documentation

## 2016-01-31 DIAGNOSIS — Z803 Family history of malignant neoplasm of breast: Secondary | ICD-10-CM | POA: Diagnosis not present

## 2016-01-31 DIAGNOSIS — Z1589 Genetic susceptibility to other disease: Secondary | ICD-10-CM | POA: Diagnosis not present

## 2016-01-31 DIAGNOSIS — Z1502 Genetic susceptibility to malignant neoplasm of ovary: Secondary | ICD-10-CM | POA: Diagnosis not present

## 2016-01-31 HISTORY — PX: LAPAROSCOPIC BILATERAL SALPINGO OOPHERECTOMY: SHX5890

## 2016-01-31 HISTORY — PX: DILATATION & CURETTAGE/HYSTEROSCOPY WITH MYOSURE: SHX6511

## 2016-01-31 SURGERY — SALPINGO-OOPHORECTOMY, BILATERAL, LAPAROSCOPIC
Anesthesia: General | Site: Vagina

## 2016-01-31 MED ORDER — MIDAZOLAM HCL 2 MG/2ML IJ SOLN
INTRAMUSCULAR | Status: DC | PRN
  Administered 2016-01-31: 1 mg via INTRAVENOUS

## 2016-01-31 MED ORDER — MEPERIDINE HCL 25 MG/ML IJ SOLN: 6.2500 mg | INTRAMUSCULAR | Status: DC | PRN

## 2016-01-31 MED ORDER — LACTATED RINGERS IR SOLN
Status: DC | PRN
  Administered 2016-01-31: 3000 mL

## 2016-01-31 MED ORDER — PROPOFOL 10 MG/ML IV BOLUS
INTRAVENOUS | Status: DC | PRN
  Administered 2016-01-31: 20 mg via INTRAVENOUS
  Administered 2016-01-31: 180 mg via INTRAVENOUS

## 2016-01-31 MED ORDER — BUPIVACAINE HCL (PF) 0.25 % IJ SOLN
INTRAMUSCULAR | Status: AC
  Filled 2016-01-31: qty 30

## 2016-01-31 MED ORDER — LIDOCAINE HCL 1 % IJ SOLN
INTRAMUSCULAR | Status: DC | PRN
  Administered 2016-01-31: 10 mL

## 2016-01-31 MED ORDER — SCOPOLAMINE 1 MG/3DAYS TD PT72
MEDICATED_PATCH | TRANSDERMAL | Status: AC
  Administered 2016-01-31: 1.5 mg via TRANSDERMAL
  Filled 2016-01-31: qty 1

## 2016-01-31 MED ORDER — LACTATED RINGERS IV SOLN
INTRAVENOUS | Status: DC
  Administered 2016-01-31 (×2): via INTRAVENOUS

## 2016-01-31 MED ORDER — ROCURONIUM BROMIDE 100 MG/10ML IV SOLN
INTRAVENOUS | Status: DC | PRN
  Administered 2016-01-31: 40 mg via INTRAVENOUS

## 2016-01-31 MED ORDER — FENTANYL CITRATE (PF) 100 MCG/2ML IJ SOLN
INTRAMUSCULAR | Status: AC
  Filled 2016-01-31: qty 2

## 2016-01-31 MED ORDER — DEXAMETHASONE SODIUM PHOSPHATE 4 MG/ML IJ SOLN
INTRAMUSCULAR | Status: AC
  Filled 2016-01-31: qty 1

## 2016-01-31 MED ORDER — OXYCODONE-ACETAMINOPHEN 5-325 MG PO TABS: 1.0000 | ORAL_TABLET | ORAL | 0 refills | Status: DC | PRN

## 2016-01-31 MED ORDER — FENTANYL CITRATE (PF) 100 MCG/2ML IJ SOLN
25.0000 ug | INTRAMUSCULAR | Status: DC | PRN
  Administered 2016-01-31: 50 ug via INTRAVENOUS

## 2016-01-31 MED ORDER — OXYCODONE HCL 5 MG/5ML PO SOLN: 5.0000 mg | Freq: Once | ORAL | Status: DC | PRN

## 2016-01-31 MED ORDER — ONDANSETRON HCL 4 MG/2ML IJ SOLN
INTRAMUSCULAR | Status: DC | PRN
  Administered 2016-01-31: 4 mg via INTRAVENOUS

## 2016-01-31 MED ORDER — FENTANYL CITRATE (PF) 250 MCG/5ML IJ SOLN
INTRAMUSCULAR | Status: AC
  Filled 2016-01-31: qty 5

## 2016-01-31 MED ORDER — METHYLENE BLUE 0.5 % INJ SOLN
INTRAVENOUS | Status: AC
  Filled 2016-01-31: qty 10

## 2016-01-31 MED ORDER — BUPIVACAINE HCL (PF) 0.25 % IJ SOLN
INTRAMUSCULAR | Status: DC | PRN
  Administered 2016-01-31: 10 mL

## 2016-01-31 MED ORDER — ONDANSETRON HCL 4 MG/2ML IJ SOLN: 4.0000 mg | Freq: Once | INTRAMUSCULAR | Status: DC | PRN

## 2016-01-31 MED ORDER — DEXAMETHASONE SODIUM PHOSPHATE 10 MG/ML IJ SOLN
INTRAMUSCULAR | Status: DC | PRN
  Administered 2016-01-31: 4 mg via INTRAVENOUS

## 2016-01-31 MED ORDER — HEPARIN SODIUM (PORCINE) 5000 UNIT/ML IJ SOLN
INTRAMUSCULAR | Status: AC
  Filled 2016-01-31: qty 1

## 2016-01-31 MED ORDER — KETOROLAC TROMETHAMINE 30 MG/ML IJ SOLN
INTRAMUSCULAR | Status: DC | PRN
  Administered 2016-01-31: 30 mg via INTRAVENOUS

## 2016-01-31 MED ORDER — SUGAMMADEX SODIUM 200 MG/2ML IV SOLN
INTRAVENOUS | Status: AC
  Filled 2016-01-31: qty 2

## 2016-01-31 MED ORDER — SCOPOLAMINE 1 MG/3DAYS TD PT72
1.0000 | MEDICATED_PATCH | Freq: Once | TRANSDERMAL | Status: DC
  Administered 2016-01-31: 1.5 mg via TRANSDERMAL

## 2016-01-31 MED ORDER — FENTANYL CITRATE (PF) 100 MCG/2ML IJ SOLN
INTRAMUSCULAR | Status: DC | PRN
  Administered 2016-01-31 (×3): 50 ug via INTRAVENOUS

## 2016-01-31 MED ORDER — ONDANSETRON HCL 4 MG/2ML IJ SOLN
INTRAMUSCULAR | Status: AC
  Filled 2016-01-31: qty 2

## 2016-01-31 MED ORDER — ACETAMINOPHEN 160 MG/5ML PO SOLN
325.0000 mg | ORAL | Status: DC | PRN
Start: 2016-01-31 — End: 2016-01-31

## 2016-01-31 MED ORDER — EPHEDRINE 5 MG/ML INJ
INTRAVENOUS | Status: AC
  Filled 2016-01-31: qty 10

## 2016-01-31 MED ORDER — LIDOCAINE HCL (CARDIAC) 20 MG/ML IV SOLN
INTRAVENOUS | Status: DC | PRN
  Administered 2016-01-31: 30 mg via INTRAVENOUS
  Administered 2016-01-31: 70 mg via INTRAVENOUS

## 2016-01-31 MED ORDER — MIDAZOLAM HCL 2 MG/2ML IJ SOLN
INTRAMUSCULAR | Status: AC
Start: 2016-01-31 — End: 2016-01-31
  Filled 2016-01-31: qty 2

## 2016-01-31 MED ORDER — LIDOCAINE HCL (CARDIAC) 20 MG/ML IV SOLN
INTRAVENOUS | Status: AC
  Filled 2016-01-31: qty 5

## 2016-01-31 MED ORDER — OXYCODONE HCL 5 MG PO TABS: 5.0000 mg | ORAL_TABLET | Freq: Once | ORAL | Status: DC | PRN

## 2016-01-31 MED ORDER — LIDOCAINE HCL 1 % IJ SOLN
INTRAMUSCULAR | Status: AC
  Filled 2016-01-31: qty 20

## 2016-01-31 MED ORDER — SUGAMMADEX SODIUM 200 MG/2ML IV SOLN
INTRAVENOUS | Status: DC | PRN
  Administered 2016-01-31: 172.6 mg via INTRAVENOUS

## 2016-01-31 MED ORDER — KETOROLAC TROMETHAMINE 30 MG/ML IJ SOLN: 30.0000 mg | Freq: Once | INTRAMUSCULAR | Status: DC

## 2016-01-31 MED ORDER — HEPARIN SODIUM (PORCINE) 5000 UNIT/ML IJ SOLN
INTRAMUSCULAR | Status: DC | PRN
  Administered 2016-01-31: 5000 [IU]

## 2016-01-31 MED ORDER — PROPOFOL 10 MG/ML IV BOLUS
INTRAVENOUS | Status: AC
  Filled 2016-01-31: qty 20

## 2016-01-31 MED ORDER — ACETAMINOPHEN 325 MG PO TABS: 325.0000 mg | ORAL_TABLET | ORAL | Status: DC | PRN

## 2016-01-31 MED ORDER — KETOROLAC TROMETHAMINE 30 MG/ML IJ SOLN
INTRAMUSCULAR | Status: AC
  Filled 2016-01-31: qty 1

## 2016-01-31 SURGICAL SUPPLY — 42 items
ADH SKN CLS APL DERMABOND .7 (GAUZE/BANDAGES/DRESSINGS) ×2
BAG SPEC RTRVL LRG 6X4 10 (ENDOMECHANICALS) ×2
BARRIER ADHS 3X4 INTERCEED (GAUZE/BANDAGES/DRESSINGS) IMPLANT
BLADE SURG 15 STRL LF C SS BP (BLADE) ×2 IMPLANT
BLADE SURG 15 STRL SS (BLADE) ×3
BRR ADH 4X3 ABS CNTRL BYND (GAUZE/BANDAGES/DRESSINGS)
CABLE HIGH FREQUENCY MONO STRZ (ELECTRODE) ×1 IMPLANT
CATH ROBINSON RED A/P 16FR (CATHETERS) ×3 IMPLANT
CLOTH BEACON ORANGE TIMEOUT ST (SAFETY) ×3 IMPLANT
CONTAINER PREFILL 10% NBF 60ML (FORM) ×6 IMPLANT
DERMABOND ADVANCED (GAUZE/BANDAGES/DRESSINGS) ×1
DERMABOND ADVANCED .7 DNX12 (GAUZE/BANDAGES/DRESSINGS) IMPLANT
DEVICE MYOSURE LITE (MISCELLANEOUS) IMPLANT
DEVICE MYOSURE REACH (MISCELLANEOUS) IMPLANT
DRSG OPSITE POSTOP 3X4 (GAUZE/BANDAGES/DRESSINGS) ×3 IMPLANT
FILTER ARTHROSCOPY CONVERTOR (FILTER) ×3 IMPLANT
FILTER SMOKE EVAC LAPAROSHD (FILTER) ×3 IMPLANT
GLOVE BIO SURGEON STRL SZ7.5 (GLOVE) ×6 IMPLANT
GLOVE BIOGEL PI IND STRL 7.0 (GLOVE) ×2 IMPLANT
GLOVE BIOGEL PI INDICATOR 7.0 (GLOVE) ×1
GOWN STRL REUS W/TWL LRG LVL3 (GOWN DISPOSABLE) ×9 IMPLANT
NS IRRIG 1000ML POUR BTL (IV SOLUTION) ×3 IMPLANT
PACK LAPAROSCOPY BASIN (CUSTOM PROCEDURE TRAY) ×3 IMPLANT
PACK TRENDGUARD 450 HYBRID PRO (MISCELLANEOUS) IMPLANT
PACK VAGINAL MINOR WOMEN LF (CUSTOM PROCEDURE TRAY) ×3 IMPLANT
PAD OB MATERNITY 4.3X12.25 (PERSONAL CARE ITEMS) ×3 IMPLANT
POUCH LAPAROSCOPIC INSTRUMENT (MISCELLANEOUS) ×3 IMPLANT
POUCH SPECIMEN RETRIEVAL 10MM (ENDOMECHANICALS) ×3 IMPLANT
PROTECTOR NERVE ULNAR (MISCELLANEOUS) ×6 IMPLANT
SEAL ROD LENS SCOPE MYOSURE (ABLATOR) ×3 IMPLANT
SET IRRIG TUBING LAPAROSCOPIC (IRRIGATION / IRRIGATOR) ×3 IMPLANT
SHEARS HARMONIC ACE PLUS 36CM (ENDOMECHANICALS) ×3 IMPLANT
SLEEVE XCEL OPT CAN 5 100 (ENDOMECHANICALS) ×3 IMPLANT
SUT PLAIN 4 0 FS 2 27 (SUTURE) ×3 IMPLANT
SUT VICRYL 0 UR6 27IN ABS (SUTURE) ×3 IMPLANT
TOWEL OR 17X24 6PK STRL BLUE (TOWEL DISPOSABLE) ×6 IMPLANT
TRENDGUARD 450 HYBRID PRO PACK (MISCELLANEOUS) ×3
TROCAR XCEL NON-BLD 11X100MML (ENDOMECHANICALS) ×3 IMPLANT
TUBING AQUILEX INFLOW (TUBING) ×3 IMPLANT
TUBING AQUILEX OUTFLOW (TUBING) ×3 IMPLANT
WARMER LAPAROSCOPE (MISCELLANEOUS) ×3 IMPLANT
WATER STERILE IRR 1000ML POUR (IV SOLUTION) ×3 IMPLANT

## 2016-01-31 NOTE — Transfer of Care (Signed)
Immediate Anesthesia Transfer of Care Note  Patient: Raphael Gibneyatricia Sahota  Procedure(s) Performed: Procedure(s) with comments: LAPAROSCOPIC BILATERAL SALPINGO OOPHORECTOMY (Bilateral) - TO FOLLOW 7:30 CASE AROUND 9:15AM  Needs one hour OR time. DILATATION & CURETTAGE/HYSTEROSCOPY WITH MYOSURE/RESECTION OF POLYP (N/A)  Patient Location: PACU  Anesthesia Type:General  Level of Consciousness: awake, alert , oriented and patient cooperative  Airway & Oxygen Therapy: Patient Spontanous Breathing and Patient connected to nasal cannula oxygen  Post-op Assessment: Report given to RN and Post -op Vital signs reviewed and stable  Post vital signs: Reviewed and stable  Last Vitals:  Vitals:   01/31/16 0736  BP: 113/78  Pulse: 67  Resp: 16  Temp: 36.8 C    Last Pain:  Vitals:   01/31/16 0736  TempSrc: Oral      Patients Stated Pain Goal: 4 (01/31/16 0736)  Complications: No apparent anesthesia complications

## 2016-01-31 NOTE — Op Note (Signed)
Kristina Pham 09/20/1960 [id number]   Post Operative Note   Date of surgery:  01/31/2016  Pre Op Dx:  Family history of breast cancer with PALB2 genetic mutation with indeterminate ovarian cancer risk. Thickened endometrial echo consistent with polyp  Post Op Dx:  Family history of breast cancer with PALB2 genetic mutation with indeterminate ovarian cancer risk.  Endometrial polyp  Procedure:  Laparoscopic bilateral salpingo-oophorectomy. Hysteroscopy D&C with Myosure resection of endometrial polyp  Surgeon:  Anastasio Auerbach  Assistant:  Uvaldo Rising  Anesthesia:  General  EBL:  Minimal  Distended media discrepancy:  100 cc estimate  Complications:  None  Specimen:  Opening cell washing, right fallopian tube and ovary, left fallopian tube and ovary, endometrial polyp, endometrial curettings to pathology  Findings: EUA:  External BUS vagina normal. Cervix normal. Uterus retroverted normal size midline mobile. Adnexa without masses   Operative:  Anterior cul-de-sac normal. Posterior cul-de-sac normal. Uterus normal size, shape and contour. Right and left fallopian tubes normal length, caliber and fimbriated ends. Right ovary free and mobile, grossly normal. Left ovary adherent to the posterior uterine surface grossly normal in appearance. No evidence of pelvic adhesions or endometriosis. Upper abdominal exam shows liver smooth with gallbladder grossly normal to limited inspection. Appendix not clearly visualized. Omental adhesions to the anterior abdominal wall in the left upper quadrant left undisturbed.   Hysteroscopy: Adequate noting fundus, anterior/posterior endometrial surfaces, right/left tubal ostia, lower uterine segment, endocervical canal all visualized. Large endometrial polyp from mid posterior endometrial surface filling the cavity extending into the upper cervical canal resected in its entirety to level the surrounding endometrium. Smaller posterior sessile type polyp  right lower uterine segment resected to the level the surrounding endometrium.  Procedure:  The patient was taken to the operating room, placed in the low dorsal lithotomy position, underwent general endotracheal anesthesia, received abdominal preparation with DuraPrep and a vaginal/perineal preparation with Betadine solution. The bladder was emptied with an in and out Foley catheterization. The timeout was performed by the surgical team. The patient was draped in the usual fashion. A vertical infraumbilical incision was made and using the 10 mm Optiview type direct entry trocar the abdomen was directly entered under direct visualization without difficulty and subsequently insufflated. Right and left 5 mm suprapubic ports were placed under direct visualization after transillumination for the vessels without difficulty. Examination of the pelvic organs and upper abdominal exam was carried out with findings noted above. An opening cell washing was then obtained and sent to cytology. The left fallopian tube and ovary were elevated, the ureter identified away from the surgical site and using the harmonic scalpel the infundibulopelvic ligament and vessels were transected without difficulty. The specimen was progressively freed from its attachments through transection of the broad ligament to the insertion of the fallopian tube. Due to the adherence of the ovary to the uterus posteriorly the ovary was dissected off of the posterior uterine surface using the harmonic scalpel and the uterine ovarian pedicle was transected freeing the specimen and noting no fallopian tube segment remained. The posterior uterine surface was examined to assure no ovarian remnant was left. The left ovary was scored to identify it from the right specimen and placed in the cul-de-sac. The right adnexa was then elevated, the ureter again identified away from the surgical site and using the harmonic scalpel in a similar procedure to the left the  specimen was excised noting the ovary was free of mobile and was not needed to be dissected from  the uterine serosa. The fallopian tube was excised at it's insertion to the uterus with no remaining external tubal segment. A 5 mm laparoscope was then placed through the left suprapubic port, an Endopouch placed through the 10 mm infraumbilical port and the specimens were retrieved through the umbilicus and sent to pathology separately. The 10 mm infraumbilical laparoscope was replaced, the abdomen reinsufflated, copiously irrigated and all pedicles were inspected showing hemostasis under low pressure situation. The suprapubic ports were removed under direct visualization showing adequate hemostasis and the infraumbilical port was backed out under direct visualization showing adequate hemostasis no evidence of hernia formation. All skin incisions were injected using 0.25% Marcaine and the infraumbilical port was closed using 0 Vicryl suture in an interrupted subcutaneous fascial stitch. The infraumbilical skin was reapproximated using 4-0 plain suture in interrupted cutaneous stitch and the suprapubic incisions closed using Dermabond skin adhesive. Sterile dressings were applied and attention was then turned to the hysteroscopic portion of the procedure. The patient was placed in the higher dorsal lithotomy position and the cervix visualized with a speculum. The anterior lip grasped with a single-tooth tenaculum and a paracervical block was placed using 10 cc's of 1% lidocaine. The cervix was gently dilated to admit the Myosure hysteroscope and hysteroscopy was performed with findings noted above. Using the Myosure resectoscopic wand the polyps were resected in their entirety to the level the surrounding endometrium. A gentle sharp curettage was performed. Both specimens were sent separately to pathology.  Repeat hysteroscopy showed an empty cavity with good distention and no evidence of perforation. The instruments  were removed and adequate hemostasis was visualized at the tenaculum site and external cervical os. The patient was given intraoperative Toradol, was awakened without difficulty and was taken to the recovery room in good condition having tolerated the procedure well.     Anastasio Auerbach MD, 10:49 AM 01/31/2016

## 2016-01-31 NOTE — Anesthesia Postprocedure Evaluation (Signed)
Anesthesia Post Note  Patient: Kristina Pham  Procedure(s) Performed: Procedure(s) (LRB): LAPAROSCOPIC BILATERAL SALPINGO OOPHORECTOMY (Bilateral) DILATATION & CURETTAGE/HYSTEROSCOPY WITH MYOSURE/RESECTION OF POLYP (N/A)  Patient location during evaluation: PACU Anesthesia Type: General Level of consciousness: awake and sedated Pain management: pain level controlled Vital Signs Assessment: post-procedure vital signs reviewed and stable Respiratory status: spontaneous breathing Cardiovascular status: stable Postop Assessment: no signs of nausea or vomiting Anesthetic complications: no     Last Vitals:  Vitals:   01/31/16 1115 01/31/16 1130  BP: 97/62   Pulse: 68   Resp: 10   Temp:  37.4 C    Last Pain:  Vitals:   01/31/16 1130  TempSrc:   PainSc: 2    Pain Goal: Patients Stated Pain Goal: 4 (01/31/16 1115)               Shamon Cothran JR,JOHN Susann GivensFRANKLIN

## 2016-01-31 NOTE — Discharge Instructions (Signed)
Postoperative Instructions Laparoscopy  Dr. Audie BoxFontaine and the nursing staff have discussed postoperative instructions with you.  If you have any questions please ask them before you leave the hospital, or call Dr Marva PandaFontaines office at [phone].    We would like to emphasize the following instructions:  **You may begin taking Ibuprofen(Advil/Motrin) or Aleve after 4:04 pm today**   ? Call the office to make your follow-up appointment as recommended by Dr Audie BoxFontaine (usually 1-2 weeks).  ? You were given a prescription, or one was ordered for you at the pharmacy you designated.  Get that prescription filled and take the medication according to instructions.  ? You may eat a regular diet, but slowly until you start having bowel movements.  ? Drink plenty of water daily and eat a light/bland diet.  ? Nothing in the vagina (intercourse, douching, objects of any kind) for 2 weeks.  When reinitiating intercourse, if it is uncomfortable, stop and make an appointment with Dr Audie BoxFontaine to be evaluated.  ? No driving for several days until the anesthesia has worn off and you are not having significant pain.  Car rides (short) are ok, as long as you are not having significant pain, but no traveling out of town until your postoperative appointment.  ? You may shower, but no baths for two weeks.  Walking up and down stairs is ok.  No heavy lifting, prolonged standing, repeated bending or any working out until your first  postoperative appointment.  ? Rest frequently, listen to your body and do not push yourself and overdo it.  ? Call if:  o Your pain medication does not seem strong enough. o Worsening pain or abdominal bloating o Persistent nausea or vomiting o Difficulty with urination or bowel movements. o Temperature of 101 degrees or higher. o Heavy vaginal bleeding.  If your period is due, you may use tampons.   o Incisions become red, tender or begin to drain. o You have any questions or  concerns

## 2016-01-31 NOTE — H&P (Signed)
The patient was examined.  I reviewed the proposed surgery and consent form with the patient.  The dictated history and physical is current and accurate and all questions were answered. The patient is ready to proceed with surgery and has a realistic understanding and expectation for the outcome.  Preoperative CA 125 9.   Dara LordsFONTAINE,Keaston Pile P MD, 8:15 AM 01/31/2016

## 2016-01-31 NOTE — Anesthesia Preprocedure Evaluation (Signed)
Anesthesia Evaluation  Patient identified by MRN, date of birth, ID band Patient awake    Reviewed: Allergy & Precautions, H&P , NPO status , Patient's Chart, lab work & pertinent test results  Airway Mallampati: I  TM Distance: >3 FB Neck ROM: full    Dental no notable dental hx. (+) Teeth Intact   Pulmonary former smoker,    Pulmonary exam normal        Cardiovascular negative cardio ROS Normal cardiovascular exam     Neuro/Psych negative psych ROS   GI/Hepatic negative GI ROS, Neg liver ROS,   Endo/Other  negative endocrine ROS  Renal/GU negative Renal ROS     Musculoskeletal   Abdominal Normal abdominal exam  (+)   Peds  Hematology negative hematology ROS (+)   Anesthesia Other Findings   Reproductive/Obstetrics negative OB ROS                             Anesthesia Physical Anesthesia Plan  ASA: I  Anesthesia Plan: General   Post-op Pain Management:    Induction: Intravenous  Airway Management Planned: Oral ETT  Additional Equipment:   Intra-op Plan:   Post-operative Plan: Extubation in OR  Informed Consent: I have reviewed the patients History and Physical, chart, labs and discussed the procedure including the risks, benefits and alternatives for the proposed anesthesia with the patient or authorized representative who has indicated his/her understanding and acceptance.   Dental Advisory Given  Plan Discussed with: CRNA and Surgeon  Anesthesia Plan Comments:         Anesthesia Quick Evaluation  

## 2016-01-31 NOTE — Anesthesia Procedure Notes (Signed)
Procedure Name: Intubation Date/Time: 01/31/2016 9:15 AM Performed by: Suella GroveMOORE, Leven Hoel C Pre-anesthesia Checklist: Patient identified, Suction available, Emergency Drugs available and Patient being monitored Patient Re-evaluated:Patient Re-evaluated prior to inductionOxygen Delivery Method: Circle system utilized and Simple face mask Preoxygenation: Pre-oxygenation with 100% oxygen Intubation Type: IV induction Ventilation: Mask ventilation without difficulty Laryngoscope Size: Mac and 3 Grade View: Grade I Tube type: Oral Tube size: 7.0 mm Number of attempts: 1 Secured at: 22 cm Tube secured with: Tape Dental Injury: Teeth and Oropharynx as per pre-operative assessment

## 2016-02-01 ENCOUNTER — Encounter (HOSPITAL_COMMUNITY): Payer: Self-pay | Admitting: Gynecology

## 2016-02-21 ENCOUNTER — Ambulatory Visit (INDEPENDENT_AMBULATORY_CARE_PROVIDER_SITE_OTHER): Payer: BLUE CROSS/BLUE SHIELD | Admitting: Gynecology

## 2016-02-21 ENCOUNTER — Encounter: Payer: Self-pay | Admitting: Gynecology

## 2016-02-21 VITALS — BP 120/78

## 2016-02-21 DIAGNOSIS — Z9889 Other specified postprocedural states: Secondary | ICD-10-CM

## 2016-02-21 NOTE — Patient Instructions (Signed)
Follow up in November 2018 for annual exam, sooner if any issues. 

## 2016-02-21 NOTE — Progress Notes (Signed)
    Dorise Robello [birth year] [id number] Gibney        56 y.o.  G2P2002 presents for her postoperative state status post laparoscopic bilateral salpingo-oophorectomy due to genetic mutation and hysteroscopic resection of endometrial polyp. Patient doing well without any complaints or issues postoperatively  Past medical history,surgical history, problem list, medications, allergies, family history and social history were all reviewed and documented in the EPIC chart.  Directed ROS with pertinent positives and negatives documented in the history of present illness/assessment and plan.  Exam: Kennon PortelaKim Gardner assistant Vitals:   02/21/16 0921  BP: 120/78   General appearance:  Normal Abdomen soft nontender without masses guarding rebound. Incision sites healed nicely. Pelvic external BUS vagina normal. Cervix normal. Uterus normal size midline mobile nontender. Adnexa without masses or tenderness.  Assessment/Plan:  56 y.o. [id number] with normal postoperative visit status post laparoscopic BSO and hysteroscopic resection of endometrial polyp. Reviewed pathology with her. She did have a focus of endometriosis on her fallopian tube otherwise normal pathology. Patient will follow up in November 2018 when due for annual exam, sooner as needed.    Dara LordsFONTAINE,TIMOTHY P MD, 9:34 AM 02/21/2016

## 2016-08-04 ENCOUNTER — Encounter: Payer: Self-pay | Admitting: Genetics

## 2016-08-04 NOTE — Progress Notes (Signed)
Amendment: The date of this amended report is July 22, 2016.  Based on the ACMG standards and guidelines for the interpretation of sequence variants ([place] 2015) that utilizes a combination of sources, e.g., internal data, published literature, population databases and in silico models, the APC c.5635G>T (p.Ala1879Ser) VUS has been reclassified to Likely Benign.  The classification of the pathogenic PALB2 c.3113G>A mutation has not been changed.

## 2016-10-07 ENCOUNTER — Ambulatory Visit: Payer: BLUE CROSS/BLUE SHIELD | Admitting: Hematology and Oncology

## 2016-10-07 NOTE — Assessment & Plan Note (Deleted)
PALB2 (partner and localizer of BRCA2):  Clinical significance: 1. Risk of breast cancer:The risk of breast cancer is 6-8 times as high among those 39-56 years of age. The cumulative breast cancer risk is 18-35% by age 105 based on a general female population risk of 8.8% to age 48. 2. Risk of pancreatic cancer: Indeterminate The magnitude of risk increases with the number of affected relatives, with the highest risk (32-fold) in individuals with three affected first-degree relatives. 3. Risk of Ovarian cancer: Also not properly documented how much the increased risk is. -------------------------------------------------------------------------- Surveillance: Annual breast MRIs and mammograms and clinical breast exams 1. Mammogram 03/09/2015: No mammographic evidence of malignancy 2. breast MRI 03/14/2015: Normal 3. Transvaginal ultrasound: Ovaries normal 4. Clinical breast exam 10/07/2016: Benign  I recommended that the patient get annual mammograms and MRIs.

## 2016-10-08 ENCOUNTER — Other Ambulatory Visit: Payer: Self-pay | Admitting: Hematology and Oncology

## 2016-10-08 DIAGNOSIS — Z1231 Encounter for screening mammogram for malignant neoplasm of breast: Secondary | ICD-10-CM

## 2016-10-16 ENCOUNTER — Ambulatory Visit
Admission: RE | Admit: 2016-10-16 | Discharge: 2016-10-16 | Disposition: A | Discharge status: Home / Self Care | Payer: BLUE CROSS/BLUE SHIELD | Source: Ambulatory Visit | Attending: Hematology and Oncology | Admitting: Hematology and Oncology

## 2016-10-16 DIAGNOSIS — Z1231 Encounter for screening mammogram for malignant neoplasm of breast: Secondary | ICD-10-CM | POA: Diagnosis not present

## 2016-10-23 ENCOUNTER — Ambulatory Visit
Admission: RE | Admit: 2016-10-23 | Discharge: 2016-10-23 | Disposition: A | Discharge status: Home / Self Care | Payer: BLUE CROSS/BLUE SHIELD | Source: Ambulatory Visit | Attending: Hematology and Oncology | Admitting: Hematology and Oncology

## 2016-10-23 DIAGNOSIS — C3490 Malignant neoplasm of unspecified part of unspecified bronchus or lung: Secondary | ICD-10-CM | POA: Diagnosis not present

## 2016-10-23 DIAGNOSIS — Z1509 Genetic susceptibility to other malignant neoplasm: Principal | ICD-10-CM

## 2016-10-23 DIAGNOSIS — Z1589 Genetic susceptibility to other disease: Principal | ICD-10-CM

## 2016-10-23 DIAGNOSIS — Z1501 Genetic susceptibility to malignant neoplasm of breast: Secondary | ICD-10-CM

## 2016-10-23 MED ORDER — GADOBENATE DIMEGLUMINE 529 MG/ML IV SOLN
15.0000 mL | Freq: Once | INTRAVENOUS | Status: AC | PRN
  Administered 2016-10-23: 15 mL via INTRAVENOUS

## 2016-10-24 ENCOUNTER — Telehealth: Payer: Self-pay | Admitting: Hematology and Oncology

## 2016-10-24 NOTE — Telephone Encounter (Signed)
I informed the patient that her breast MRIs were normal.

## 2016-11-06 ENCOUNTER — Encounter: Payer: Self-pay | Admitting: Family Medicine

## 2016-12-01 DIAGNOSIS — Z0100 Encounter for examination of eyes and vision without abnormal findings: Secondary | ICD-10-CM | POA: Diagnosis not present

## 2016-12-23 ENCOUNTER — Ambulatory Visit (INDEPENDENT_AMBULATORY_CARE_PROVIDER_SITE_OTHER): Payer: BLUE CROSS/BLUE SHIELD | Admitting: *Deleted

## 2016-12-23 DIAGNOSIS — Z23 Encounter for immunization: Secondary | ICD-10-CM

## 2016-12-29 ENCOUNTER — Encounter: Payer: Self-pay | Admitting: Gynecology

## 2016-12-29 ENCOUNTER — Encounter: Payer: BLUE CROSS/BLUE SHIELD | Admitting: Gynecology

## 2016-12-29 ENCOUNTER — Ambulatory Visit: Payer: BLUE CROSS/BLUE SHIELD | Admitting: Gynecology

## 2016-12-29 VITALS — BP 122/78 | Ht 65.0 in | Wt 185.0 lb

## 2016-12-29 DIAGNOSIS — Z01411 Encounter for gynecological examination (general) (routine) with abnormal findings: Secondary | ICD-10-CM | POA: Diagnosis not present

## 2016-12-29 DIAGNOSIS — N952 Postmenopausal atrophic vaginitis: Secondary | ICD-10-CM

## 2016-12-29 NOTE — Progress Notes (Signed)
    Raphael Gibneyatricia Morganti [birth year] [id number]        56 y.o.  G2P2002 for annual gynecologic exam.  Doing well.  Past medical history,surgical history, problem list, medications, allergies, family history and social history were all reviewed and documented as reviewed in the EPIC chart.  ROS:  Performed with pertinent positives and negatives included in the history, assessment and plan.   Additional significant findings : None   Exam: Kennon PortelaKim Gardner assistant Vitals:   12/29/16 1231  BP: 122/78  Weight: 185 lb (83.9 kg)  Height: 5\' 5"  (1.651 m)   Body mass index is 30.79 kg/m.  General appearance:  Normal affect, orientation and appearance. Skin: Grossly normal HEENT: Without gross lesions.  No cervical or supraclavicular adenopathy. Thyroid normal.  Lungs:  Clear without wheezing, rales or rhonchi Cardiac: RR, without RMG Abdominal:  Soft, nontender, without masses, guarding, rebound, organomegaly or hernia Breasts:  Examined lying and sitting without masses, retractions, discharge or axillary adenopathy. Pelvic:  Ext, BUS, Vagina: Normal with atrophic changes  Cervix: Normal  Uterus: Anteverted, normal size, shape and contour, midline and mobile nontender   Adnexa: Without masses or tenderness    Anus and perineum: Normal   Rectovaginal: Normal sphincter tone without palpated masses or tenderness.    Assessment/Plan:  56 y.o. 752P2002 female for annual gynecologic exam.   1. Postmenopausal/atrophic genital changes.  No significant hot flushes, night sweats, vaginal dryness or any vaginal bleeding. 2. 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  Underwent prophylactic laparoscopic BSO last year.  Is doing annual mammography and MRI.  With mammogram 09/2016 and MRI 10/2016.  Has discussed the issues of prophylactic mastectomy in the past but at this point she does not want to proceed with this.  Breast exam normal today.  We will continue with annual mammography  and MRI. 3. Colonoscopy 2017.  Repeat at their recommended interval. 4. Pap smear 12/2014.  No Pap smear done today.  No history of abnormal Pap smears previously.  Plan repeat Pap smear next year at 3-year interval. 5. DEXA never.  Will plan further into the menopause. 6. Health maintenance.  No routine lab work done as patient does use elsewhere.  Follow-up 1 year, sooner as needed.   Dara LordsFONTAINE,Travarius Lange P MD, 1:07 PM 12/29/2016

## 2016-12-29 NOTE — Patient Instructions (Signed)
Follow-up in 1 year for annual exam, sooner if any issues. 

## 2017-01-18 NOTE — Progress Notes (Signed)
HPI:  Here for CPE:  -Concerns and/or follow up today:  Hyperlipidemia: -mild -treating with fish oil and lifestyle in the past  Seasonal allergies: -used flonase in the past  Increased Risk Breast/pancreatic/ovarian Ca/PALB2 mutation: -managed and monitored by onc and gyn for this and her increased ca risks  -has chosen annual mammo/breast MRIs - managed by her oncologist -s/p prophylactic bilat salpingo-oophorectomy -Considering prophylactic mastectomy  -Diet: variety of foods, balance and well rounded, except on holidays -Exercise: regular exercise -Taking folic acid, vitamin D or calcium: no -Diabetes and Dyslipidemia Screening: last done last year -Vaccines: see vaccine section EPIC -pap history: 12/2014 w/ gyn -FDLMP: see nursing notes -sexual activity: yes, female partner, no new partners -wants STI testing (Hep C if born [birth year]-65): no -FH breast, colon or ovarian ca: see FH Last mammogram: 10/2016 bilat breast MRI Last colon cancer screening: 2017 DEXA (>/= 65): n/a  -Alcohol, Tobacco, drug use: see social history  Review of Systems - no fevers, unintentional weight loss, vision loss, hearing loss, chest pain, sob, hemoptysis, melena, hematochezia, hematuria, genital discharge, changing or concerning skin lesions, bleeding, bruising, loc, thoughts of self harm or SI  Past Medical History:  Diagnosis Date  . ALLERGIC RHINITIS 11/23/2007   Qualifier: Diagnosis of  By: Sherlynn Stalls, CMA, [place]    . History of colonic polyps 11/23/2007   Repeat colonoscopy advised in 2018 per GI report    . Microscopic hematuria    had work up with urologist in the past for this  . Monoallelic mutation of PALB2 gene 2017  . Radiculopathy 12/05/2011    Past Surgical History:  Procedure Laterality Date  . BARTHOLIN CYST MARSUPIALIZATION    . Bone marrow extraction    . DILATATION & CURETTAGE/HYSTEROSCOPY WITH MYOSURE N/A 01/31/2016   Procedure: DILATATION & CURETTAGE/HYSTEROSCOPY WITH  MYOSURE/RESECTION OF POLYP;  Surgeon: Anastasio Auerbach, MD;  Location: [facility];  Service: Gynecology;  Laterality: N/A;  . LAPAROSCOPIC BILATERAL SALPINGO OOPHERECTOMY Bilateral 01/31/2016   Procedure: LAPAROSCOPIC BILATERAL SALPINGO OOPHORECTOMY;  Surgeon: Anastasio Auerbach, MD;  Location: [facility];  Service: Gynecology;  Laterality: Bilateral;  TO FOLLOW 7:30 CASE AROUND 9:15AM  Needs one hour OR time.  . OTOPLASTY    . TONSILLECTOMY      Family History  Problem Relation Age of Onset  . Breast cancer Mother 23  . Heart disease Mother   . Lung cancer Mother        smoker  . Breast cancer Maternal Aunt 52  . Lung cancer Maternal Aunt        smoker  . Breast cancer Maternal Grandmother 46  . Colon cancer Paternal Grandmother        dx in her 47s  . Ovarian cancer Paternal Aunt        dx in her 77s  . Leukemia Father        dx with CML in his 20s  . Colon polyps Father   . Rheumatic fever Maternal Grandfather     Social History   Socioeconomic History  . Marital status: Married    Spouse name: None  . Number of children: 2  . Years of education: None  . Highest education level: None  Social Needs  . Financial resource strain: None  . Food insecurity - worry: None  . Food insecurity - inability: None  . Transportation needs - medical: None  . Transportation needs - non-medical: None  Occupational History  . None  Tobacco Use  . Smoking  status: Former Smoker    Packs/day: 1.00    Types: Cigarettes, E-cigarettes    Last attempt to quit: 12/04/1996    Years since quitting: 20.1  . Smokeless tobacco: Never Used  Substance and Sexual Activity  . Alcohol use: Yes    Alcohol/week: 1.2 oz    Types: 2 Standard drinks or equivalent per week    Comment: OCCASIONALLY  . Drug use: No  . Sexual activity: Yes    Birth control/protection: Condom    Comment: 1st intercourse 56 yo-More than 5 partners  Other Topics Concern  . None  Social History Narrative  . None      Current Outpatient Medications:  .  cetirizine (ZYRTEC) 10 MG tablet, Take 10 mg by mouth daily., Disp: , Rfl:  .  cholecalciferol (VITAMIN D) 1000 units tablet, Take 1,000 Units by mouth daily., Disp: , Rfl:  .  Omega-3 Fatty Acids (FISH OIL PO), Take 1 capsule by mouth daily. , Disp: , Rfl:  .  vitamin B-12 (CYANOCOBALAMIN) 1000 MCG tablet, Take 1,000 mcg by mouth daily., Disp: , Rfl:   EXAM:  Vitals:   01/19/17 0726  BP: 92/62  Pulse: 62  Temp: 98.2 F (36.8 C)    GENERAL: vitals reviewed and listed below, alert, oriented, appears well hydrated and in no acute distress  HEENT: head atraumatic, PERRLA, normal appearance of eyes, ears, nose and mouth. moist mucus membranes.  NECK: supple, no masses or lymphadenopathy  LUNGS: clear to auscultation bilaterally, no rales, rhonchi or wheeze  CV: HRRR, no peripheral edema or cyanosis, normal pedal pulses  ABDOMEN: bowel sounds normal, soft, non tender to palpation, no masses, no rebound or guarding  GU/BREAST: Client, does with her specialist  SKIN: no rash or abnormal lesions  MS: normal gait, moves all extremities normally  NEURO: normal gait, speech and thought processing grossly intact, muscle tone grossly intact throughout  PSYCH: normal affect, pleasant and cooperative  ASSESSMENT AND PLAN:  Discussed the following assessment and plan:  PREVENTIVE EXAM: -Discussed and advised all [place] preventive services health task force level A and B recommendations for age, sex and risks. -Advised at least 150 minutes of exercise per week and a healthy diet with avoidance of (less then 1 serving per week) processed foods, white starches, red meat, fast foods and sweets and consisting of: * 5-9 servings of fresh fruits and vegetables (not corn or potatoes) *nuts and seeds, beans *olives and olive oil *lean meats such as fish and white chicken  *whole grains -labs, studies and vaccines per orders this encounter  2.  Screening for depression -neg  3. Hyperlipidemia, unspecified hyperlipidemia type -lifestyle - Lipid panel   Patient advised to return to clinic immediately if symptoms worsen or persist or new concerns.  Patient Instructions  Health Maintenance for Postmenopausal Women Menopause is a normal process in which your reproductive ability comes to an end. This process happens gradually over a span of months to years, usually between the ages of 80 and 62. Menopause is complete when you have missed 12 consecutive menstrual periods. It is important to talk with your health care provider about some of the most common conditions that affect postmenopausal women, such as heart disease, cancer, and bone loss (osteoporosis). Adopting a healthy lifestyle and getting preventive care can help to promote your health and wellness. Those actions can also lower your chances of developing some of these common conditions. What should I know about menopause? During menopause, you may experience a  number of symptoms, such as:  Moderate-to-severe hot flashes.  Night sweats.  Decrease in sex drive.  Mood swings.  Headaches.  Tiredness.  Irritability.  Memory problems.  Insomnia.  Choosing to treat or not to treat menopausal changes is an individual decision that you make with your health care provider. What should I know about hormone replacement therapy and supplements? Hormone therapy products are effective for treating symptoms that are associated with menopause, such as hot flashes and night sweats. Hormone replacement carries certain risks, especially as you become older. If you are thinking about using estrogen or estrogen with progestin treatments, discuss the benefits and risks with your health care provider. What should I know about heart disease and stroke? Heart disease, heart attack, and stroke become more likely as you age. This may be due, in part, to the hormonal changes that your body  experiences during menopause. These can affect how your body processes dietary fats, triglycerides, and cholesterol. Heart attack and stroke are both medical emergencies. There are many things that you can do to help prevent heart disease and stroke:  Have your blood pressure checked at least every 1-2 years. High blood pressure causes heart disease and increases the risk of stroke.  If you are 93-6 years old, ask your health care provider if you should take aspirin to prevent a heart attack or a stroke.  Do not use any tobacco products, including cigarettes, chewing tobacco, or electronic cigarettes. If you need help quitting, ask your health care provider.  It is important to eat a healthy diet and maintain a healthy weight. ? Be sure to include plenty of vegetables, fruits, low-fat dairy products, and lean protein. ? Avoid eating foods that are high in solid fats, added sugars, or salt (sodium).  Get regular exercise. This is one of the most important things that you can do for your health. ? Try to exercise for at least 150 minutes each week. The type of exercise that you do should increase your heart rate and make you sweat. This is known as moderate-intensity exercise. ? Try to do strengthening exercises at least twice each week. Do these in addition to the moderate-intensity exercise.  Know your numbers.Ask your health care provider to check your cholesterol and your blood glucose. Continue to have your blood tested as directed by your health care provider.  What should I know about cancer screening? There are several types of cancer. Take the following steps to reduce your risk and to catch any cancer development as early as possible. Breast Cancer  Practice breast self-awareness. ? This means understanding how your breasts normally appear and feel. ? It also means doing regular breast self-exams. Let your health care provider know about any changes, no matter how small.  If you  are 2 or older, have a clinician do a breast exam (clinical breast exam or CBE) every year. Depending on your age, family history, and medical history, it may be recommended that you also have a yearly breast X-ray (mammogram).  If you have a family history of breast cancer, talk with your health care provider about genetic screening.  If you are at high risk for breast cancer, talk with your health care provider about having an MRI and a mammogram every year.  Breast cancer (BRCA) gene test is recommended for women who have family members with BRCA-related cancers. Results of the assessment will determine the need for genetic counseling and BRCA1 and for BRCA2 testing. BRCA-related cancers include these  types: ? Breast. This occurs in males or females. ? Ovarian. ? Tubal. This may also be called fallopian tube cancer. ? Cancer of the abdominal or pelvic lining (peritoneal cancer). ? Prostate. ? Pancreatic.  Cervical, Uterine, and Ovarian Cancer Your health care provider may recommend that you be screened regularly for cancer of the pelvic organs. These include your ovaries, uterus, and vagina. This screening involves a pelvic exam, which includes checking for microscopic changes to the surface of your cervix (Pap test).  For women ages 21-65, health care providers may recommend a pelvic exam and a Pap test every three years. For women ages 21-65, they may recommend the Pap test and pelvic exam, combined with testing for human papilloma virus (HPV), every five years. Some types of HPV increase your risk of cervical cancer. Testing for HPV may also be done on women of any age who have unclear Pap test results.  Other health care providers may not recommend any screening for nonpregnant women who are considered low risk for pelvic cancer and have no symptoms. Ask your health care provider if a screening pelvic exam is right for you.  If you have had past treatment for cervical cancer or a  condition that could lead to cancer, you need Pap tests and screening for cancer for at least 20 years after your treatment. If Pap tests have been discontinued for you, your risk factors (such as having a new sexual partner) need to be reassessed to determine if you should start having screenings again. Some women have medical problems that increase the chance of getting cervical cancer. In these cases, your health care provider may recommend that you have screening and Pap tests more often.  If you have a family history of uterine cancer or ovarian cancer, talk with your health care provider about genetic screening.  If you have vaginal bleeding after reaching menopause, tell your health care provider.  There are currently no reliable tests available to screen for ovarian cancer.  Lung Cancer Lung cancer screening is recommended for adults 27-41 years old who are at high risk for lung cancer because of a history of smoking. A yearly low-dose CT scan of the lungs is recommended if you:  Currently smoke.  Have a history of at least 30 pack-years of smoking and you currently smoke or have quit within the past 15 years. A pack-year is smoking an average of one pack of cigarettes per day for one year.  Yearly screening should:  Continue until it has been 15 years since you quit.  Stop if you develop a health problem that would prevent you from having lung cancer treatment.  Colorectal Cancer  This type of cancer can be detected and can often be prevented.  Routine colorectal cancer screening usually begins at age 34 and continues through age 45.  If you have risk factors for colon cancer, your health care provider may recommend that you be screened at an earlier age.  If you have a family history of colorectal cancer, talk with your health care provider about genetic screening.  Your health care provider may also recommend using home test kits to check for hidden blood in your stool.  A  small camera at the end of a tube can be used to examine your colon directly (sigmoidoscopy or colonoscopy). This is done to check for the earliest forms of colorectal cancer.  Direct examination of the colon should be repeated every 5-10 years until age 48. However, if early  forms of precancerous polyps or small growths are found or if you have a family history or genetic risk for colorectal cancer, you may need to be screened more often.  Skin Cancer  Check your skin from head to toe regularly.  Monitor any moles. Be sure to tell your health care provider: ? About any new moles or changes in moles, especially if there is a change in a mole's shape or color. ? If you have a mole that is larger than the size of a pencil eraser.  If any of your family members has a history of skin cancer, especially at a young age, talk with your health care provider about genetic screening.  Always use sunscreen. Apply sunscreen liberally and repeatedly throughout the day.  Whenever you are outside, protect yourself by wearing long sleeves, pants, a wide-brimmed hat, and sunglasses.  What should I know about osteoporosis? Osteoporosis is a condition in which bone destruction happens more quickly than new bone creation. After menopause, you may be at an increased risk for osteoporosis. To help prevent osteoporosis or the bone fractures that can happen because of osteoporosis, the following is recommended:  If you are 88-36 years old, get at least 1,000 mg of calcium and at least 600 mg of vitamin D per day.  If you are older than age 20 but younger than age 28, get at least 1,200 mg of calcium and at least 600 mg of vitamin D per day.  If you are older than age 67, get at least 1,200 mg of calcium and at least 800 mg of vitamin D per day.  Smoking and excessive alcohol intake increase the risk of osteoporosis. Eat foods that are rich in calcium and vitamin D, and do weight-bearing exercises several times  each week as directed by your health care provider. What should I know about how menopause affects my mental health? Depression may occur at any age, but it is more common as you become older. Common symptoms of depression include:  Low or sad mood.  Changes in sleep patterns.  Changes in appetite or eating patterns.  Feeling an overall lack of motivation or enjoyment of activities that you previously enjoyed.  Frequent crying spells.  Talk with your health care provider if you think that you are experiencing depression. What should I know about immunizations? It is important that you get and maintain your immunizations. These include:  Tetanus, diphtheria, and pertussis (Tdap) booster vaccine.  Influenza every year before the flu season begins.  Pneumonia vaccine.  Shingles vaccine.  Your health care provider may also recommend other immunizations. This information is not intended to replace advice given to you by your health care provider. Make sure you discuss any questions you have with your health care provider. Document Released: 03/28/2005 Document Revised: 08/24/2015 Document Reviewed: 11/07/2014 Elsevier Interactive Patient Education  2018 [employer].    No Follow-up on file.  Colin Benton R., DO

## 2017-01-19 ENCOUNTER — Encounter: Payer: Self-pay | Admitting: Family Medicine

## 2017-01-19 ENCOUNTER — Ambulatory Visit (INDEPENDENT_AMBULATORY_CARE_PROVIDER_SITE_OTHER): Payer: BLUE CROSS/BLUE SHIELD | Admitting: Family Medicine

## 2017-01-19 VITALS — BP 92/62 | HR 62 | Temp 98.2°F | Ht 66.5 in | Wt 187.7 lb

## 2017-01-19 DIAGNOSIS — Z0000 Encounter for general adult medical examination without abnormal findings: Secondary | ICD-10-CM | POA: Diagnosis not present

## 2017-01-19 DIAGNOSIS — Z1331 Encounter for screening for depression: Secondary | ICD-10-CM

## 2017-01-19 DIAGNOSIS — E785 Hyperlipidemia, unspecified: Secondary | ICD-10-CM

## 2017-01-19 LAB — LIPID PANEL
CHOL/HDL RATIO: 4
CHOLESTEROL: 236 mg/dL — AB (ref 0–200)
HDL: 55.5 mg/dL (ref >39.00)
LDL Cholesterol: 168 mg/dL — ABNORMAL HIGH (ref 0–99)
NonHDL: 180.95
TRIGLYCERIDES: 66 mg/dL (ref 0.0–149.0)
VLDL: 13.2 mg/dL (ref 0.0–40.0)

## 2017-01-19 LAB — HEMOGLOBIN A1C: Hgb A1c MFr Bld: 5.2 % (ref 4.6–6.5)

## 2017-01-19 NOTE — Patient Instructions (Signed)
Health Maintenance for Postmenopausal Women Menopause is a normal process in which your reproductive ability comes to an end. This process happens gradually over a span of months to years, usually between the ages of 22 and 9. Menopause is complete when you have missed 12 consecutive menstrual periods. It is important to talk with your health care provider about some of the most common conditions that affect postmenopausal women, such as heart disease, cancer, and bone loss (osteoporosis). Adopting a healthy lifestyle and getting preventive care can help to promote your health and wellness. Those actions can also lower your chances of developing some of these common conditions. What should I know about menopause? During menopause, you may experience a number of symptoms, such as:  Moderate-to-severe hot flashes.  Night sweats.  Decrease in sex drive.  Mood swings.  Headaches.  Tiredness.  Irritability.  Memory problems.  Insomnia.  Choosing to treat or not to treat menopausal changes is an individual decision that you make with your health care provider. What should I know about hormone replacement therapy and supplements? Hormone therapy products are effective for treating symptoms that are associated with menopause, such as hot flashes and night sweats. Hormone replacement carries certain risks, especially as you become older. If you are thinking about using estrogen or estrogen with progestin treatments, discuss the benefits and risks with your health care provider. What should I know about heart disease and stroke? Heart disease, heart attack, and stroke become more likely as you age. This may be due, in part, to the hormonal changes that your body experiences during menopause. These can affect how your body processes dietary fats, triglycerides, and cholesterol. Heart attack and stroke are both medical emergencies. There are many things that you can do to help prevent heart disease  and stroke:  Have your blood pressure checked at least every 1-2 years. High blood pressure causes heart disease and increases the risk of stroke.  If you are 56-22 years old, ask your health care provider if you should take aspirin to prevent a heart attack or a stroke.  Do not use any tobacco products, including cigarettes, chewing tobacco, or electronic cigarettes. If you need help quitting, ask your health care provider.  It is important to eat a healthy diet and maintain a healthy weight. ? Be sure to include plenty of vegetables, fruits, low-fat dairy products, and lean protein. ? Avoid eating foods that are high in solid fats, added sugars, or salt (sodium).  Get regular exercise. This is one of the most important things that you can do for your health. ? Try to exercise for at least 150 minutes each week. The type of exercise that you do should increase your heart rate and make you sweat. This is known as moderate-intensity exercise. ? Try to do strengthening exercises at least twice each week. Do these in addition to the moderate-intensity exercise.  Know your numbers.Ask your health care provider to check your cholesterol and your blood glucose. Continue to have your blood tested as directed by your health care provider.  What should I know about cancer screening? There are several types of cancer. Take the following steps to reduce your risk and to catch any cancer development as early as possible. Breast Cancer  Practice breast self-awareness. ? This means understanding how your breasts normally appear and feel. ? It also means doing regular breast self-exams. Let your health care provider know about any changes, no matter how small.  If you are 56  or older, have a clinician do a breast exam (clinical breast exam or CBE) every year. Depending on your age, family history, and medical history, it may be recommended that you also have a yearly breast X-ray (mammogram).  If you  have a family history of breast cancer, talk with your health care provider about genetic screening.  If you are at high risk for breast cancer, talk with your health care provider about having an MRI and a mammogram every year.  Breast cancer (BRCA) gene test is recommended for women who have family members with BRCA-related cancers. Results of the assessment will determine the need for genetic counseling and BRCA1 and for BRCA2 testing. BRCA-related cancers include these types: ? Breast. This occurs in males or females. ? Ovarian. ? Tubal. This may also be called fallopian tube cancer. ? Cancer of the abdominal or pelvic lining (peritoneal cancer). ? Prostate. ? Pancreatic.  Cervical, Uterine, and Ovarian Cancer Your health care provider may recommend that you be screened regularly for cancer of the pelvic organs. These include your ovaries, uterus, and vagina. This screening involves a pelvic exam, which includes checking for microscopic changes to the surface of your cervix (Pap test).  For women ages 56-65, health care providers may recommend a pelvic exam and a Pap test every three years. For women ages 56-65, they may recommend the Pap test and pelvic exam, combined with testing for human papilloma virus (HPV), every five years. Some types of HPV increase your risk of cervical cancer. Testing for HPV may also be done on women of any age who have unclear Pap test results.  Other health care providers may not recommend any screening for nonpregnant women who are considered low risk for pelvic cancer and have no symptoms. Ask your health care provider if a screening pelvic exam is right for you.  If you have had past treatment for cervical cancer or a condition that could lead to cancer, you need Pap tests and screening for cancer for at least 56 years after your treatment. If Pap tests have been discontinued for you, your risk factors (such as having a new sexual partner) need to be  reassessed to determine if you should start having screenings again. Some women have medical problems that increase the chance of getting cervical cancer. In these cases, your health care provider may recommend that you have screening and Pap tests more often.  If you have a family history of uterine cancer or ovarian cancer, talk with your health care provider about genetic screening.  If you have vaginal bleeding after reaching menopause, tell your health care provider.  There are currently no reliable tests available to screen for ovarian cancer.  Lung Cancer Lung cancer screening is recommended for adults 53-8 years old who are at high risk for lung cancer because of a history of smoking. A yearly low-dose CT scan of the lungs is recommended if you:  Currently smoke.  Have a history of at least 30 pack-years of smoking and you currently smoke or have quit within the past 15 years. A pack-year is smoking an average of one pack of cigarettes per day for one year.  Yearly screening should:  Continue until it has been 15 years since you quit.  Stop if you develop a health problem that would prevent you from having lung cancer treatment.  Colorectal Cancer  This type of cancer can be detected and can often be prevented.  Routine colorectal cancer screening usually begins at  age 42 and continues through age 45.  If you have risk factors for colon cancer, your health care provider may recommend that you be screened at an earlier age.  If you have a family history of colorectal cancer, talk with your health care provider about genetic screening.  Your health care provider may also recommend using home test kits to check for hidden blood in your stool.  A small camera at the end of a tube can be used to examine your colon directly (sigmoidoscopy or colonoscopy). This is done to check for the earliest forms of colorectal cancer.  Direct examination of the colon should be repeated every  5-10 years until age 71. However, if early forms of precancerous polyps or small growths are found or if you have a family history or genetic risk for colorectal cancer, you may need to be screened more often.  Skin Cancer  Check your skin from head to toe regularly.  Monitor any moles. Be sure to tell your health care provider: ? About any new moles or changes in moles, especially if there is a change in a mole's shape or color. ? If you have a mole that is larger than the size of a pencil eraser.  If any of your family members has a history of skin cancer, especially at a young age, talk with your health care provider about genetic screening.  Always use sunscreen. Apply sunscreen liberally and repeatedly throughout the day.  Whenever you are outside, protect yourself by wearing long sleeves, pants, a wide-brimmed hat, and sunglasses.  What should I know about osteoporosis? Osteoporosis is a condition in which bone destruction happens more quickly than new bone creation. After menopause, you may be at an increased risk for osteoporosis. To help prevent osteoporosis or the bone fractures that can happen because of osteoporosis, the following is recommended:  If you are 46-71 years old, get at least 1,000 mg of calcium and at least 600 mg of vitamin D per day.  If you are older than age 55 but younger than age 65, get at least 1,200 mg of calcium and at least 600 mg of vitamin D per day.  If you are older than age 54, get at least 1,200 mg of calcium and at least 800 mg of vitamin D per day.  Smoking and excessive alcohol intake increase the risk of osteoporosis. Eat foods that are rich in calcium and vitamin D, and do weight-bearing exercises several times each week as directed by your health care provider. What should I know about how menopause affects my mental health? Depression may occur at any age, but it is more common as you become older. Common symptoms of depression  include:  Low or sad mood.  Changes in sleep patterns.  Changes in appetite or eating patterns.  Feeling an overall lack of motivation or enjoyment of activities that you previously enjoyed.  Frequent crying spells.  Talk with your health care provider if you think that you are experiencing depression. What should I know about immunizations? It is important that you get and maintain your immunizations. These include:  Tetanus, diphtheria, and pertussis (Tdap) booster vaccine.  Influenza every year before the flu season begins.  Pneumonia vaccine.  Shingles vaccine.  Your health care provider may also recommend other immunizations. This information is not intended to replace advice given to you by your health care provider. Make sure you discuss any questions you have with your health care provider. Document Released: 03/28/2005  Document Revised: 08/24/2015 Document Reviewed: 11/07/2014 Elsevier Interactive Patient Education  2018 [employer]  

## 2017-01-27 ENCOUNTER — Telehealth: Payer: Self-pay | Admitting: Family Medicine

## 2017-01-27 NOTE — Telephone Encounter (Signed)
Called back in and was given lab results.   Also made follow up appt with Dr. Selena BattenKim for 05/21/17 at 8:45am.

## 2017-01-27 NOTE — Telephone Encounter (Signed)
-----   Message from Starla Linkarolyn J Oakley, RN sent at 01/27/2017  9:35 AM EST ----- Called patient and left message to return call.

## 2017-02-26 ENCOUNTER — Encounter: Payer: Self-pay | Admitting: Family Medicine

## 2017-03-10 NOTE — Assessment & Plan Note (Signed)
Breast Cancer Surveillance:  Mammogram 10/16/16: No mammographic evidence of malignancy, Density B MRI Breasts: 10/23/16: No evidence of malignancy  Continue annual mammograms and MRIs (will plan to alternate them 6 months apart)   Risk of pancreatic and ovarian cancers: Unfortunately there are no screening studies that are helpful. Clinical monitoring. Her mother was also diagnosed with PALB2 mutation. Her daughter is yet to be screened for this.   

## 2017-03-11 ENCOUNTER — Inpatient Hospital Stay: Payer: BLUE CROSS/BLUE SHIELD | Attending: Hematology and Oncology | Admitting: Hematology and Oncology

## 2017-03-11 DIAGNOSIS — Z1589 Genetic susceptibility to other disease: Secondary | ICD-10-CM

## 2017-03-11 DIAGNOSIS — Z1509 Genetic susceptibility to other malignant neoplasm: Secondary | ICD-10-CM | POA: Diagnosis not present

## 2017-03-11 DIAGNOSIS — Z8481 Family history of carrier of genetic disease: Secondary | ICD-10-CM

## 2017-03-11 DIAGNOSIS — Z1502 Genetic susceptibility to malignant neoplasm of ovary: Secondary | ICD-10-CM | POA: Diagnosis not present

## 2017-03-11 DIAGNOSIS — Z148 Genetic carrier of other disease: Secondary | ICD-10-CM

## 2017-03-11 DIAGNOSIS — Z1501 Genetic susceptibility to malignant neoplasm of breast: Secondary | ICD-10-CM | POA: Diagnosis not present

## 2017-03-11 MED ORDER — CALCIUM CITRATE-VITAMIN D 500-500 MG-UNIT PO PACK: 1000.0000 mg | PACK | Freq: Every morning | ORAL | Status: DC

## 2017-03-11 NOTE — Progress Notes (Signed)
Patient Care Team: Lucretia Kern, DO as PCP - General (Family Medicine)  DIAGNOSIS:  Encounter Diagnosis  Name Primary?  . Monoallelic mutation of PALB2 gene     CHIEF COMPLIANT: Follow-up on PALB2 gene mutation  INTERVAL HISTORY: Kristina Pham is a 46-year with above-mentioned history of PAL gene mutation who is here for annual follow-up. B2 last in August and September she had a mammogram and a breast MRI both of which were normal.  In December she underwent bilateral salpingo-nephrectomies.  She did very well from the surgery.  She has no problems from it.  She is thinking of undergoing bilateral mastectomies with reconstruction this year.  REVIEW OF SYSTEMS:   Constitutional: Denies fevers, chills or abnormal weight loss Eyes: Denies blurriness of vision Ears, nose, mouth, throat, and face: Denies mucositis or sore throat Respiratory: Denies cough, dyspnea or wheezes Cardiovascular: Denies palpitation, chest discomfort Gastrointestinal:  Denies nausea, heartburn or change in bowel habits Skin: Denies abnormal skin rashes Lymphatics: Denies new lymphadenopathy or easy bruising Neurological:Denies numbness, tingling or new weaknesses Behavioral/Psych: Mood is stable, no new changes  Extremities: No lower extremity edema Breast:  denies any pain or lumps or nodules in either breasts All other systems were reviewed with the patient and are negative.  I have reviewed the past medical history, past surgical history, social history and family history with the patient and they are unchanged from previous note.  ALLERGIES:  is allergic to penicillins.  MEDICATIONS:  Current Outpatient Medications  Medication Sig Dispense Refill  . cetirizine (ZYRTEC) 10 MG tablet Take 10 mg by mouth daily.    . cholecalciferol (VITAMIN D) 1000 units tablet Take 1,000 Units by mouth daily.    . Omega-3 Fatty Acids (FISH OIL PO) Take 1 capsule by mouth daily.     . vitamin B-12 (CYANOCOBALAMIN)  1000 MCG tablet Take 1,000 mcg by mouth daily.     No current facility-administered medications for this visit.     PHYSICAL EXAMINATION: ECOG PERFORMANCE STATUS: 0 - Asymptomatic  Vitals:   03/11/17 1525  BP: 113/73  Pulse: 73  Resp: 18  Temp: 98.1 F (36.7 C)  SpO2: 98%   Filed Weights   03/11/17 1525  Weight: 192 lb (87.1 kg)    GENERAL:alert, no distress and comfortable SKIN: skin color, texture, turgor are normal, no rashes or significant lesions EYES: normal, Conjunctiva are pink and non-injected, sclera clear OROPHARYNX:no exudate, no erythema and lips, buccal mucosa, and tongue normal  NECK: supple, thyroid normal size, non-tender, without nodularity LYMPH:  no palpable lymphadenopathy in the cervical, axillary or inguinal LUNGS: clear to auscultation and percussion with normal breathing effort HEART: regular rate & rhythm and no murmurs and no lower extremity edema ABDOMEN:abdomen soft, non-tender and normal bowel sounds MUSCULOSKELETAL:no cyanosis of digits and no clubbing  NEURO: alert & oriented x 3 with fluent speech, no focal motor/sensory deficits EXTREMITIES: No lower extremity edema BREAST: No palpable masses or nodules in either right or left breasts. No palpable axillary supraclavicular or infraclavicular adenopathy no breast tenderness or nipple discharge. (exam performed in the presence of a chaperone)  LABORATORY DATA:  I have reviewed the data as listed CMP Latest Ref Rng & Units 01/28/2016 01/09/2014 01/06/2013  Glucose 65 - 99 mg/dL 93 84 98  BUN 6 - 20 mg/dL '12 20 16  ' Creatinine 0.44 - 1.00 mg/dL 0.75 0.9 0.9  Sodium 135 - 145 mmol/L 140 140 144  Potassium 3.5 - 5.1 mmol/L 4.0 4.5  4.3  Chloride 101 - 111 mmol/L 106 105 104  CO2 22 - 32 mmol/L 29 25 32  Calcium 8.9 - 10.3 mg/dL 9.4 9.4 9.9  Total Protein 6.5 - 8.1 g/dL 6.7 6.7 -  Total Bilirubin 0.3 - 1.2 mg/dL 0.8 0.5 -  Alkaline Phos 38 - 126 U/L 52 59 -  AST 15 - 41 U/L 24 21 -  ALT 14 -  54 U/L 29 22 -    Lab Results  Component Value Date   WBC 5.2 01/28/2016   HGB 13.8 01/28/2016   HCT 41.5 01/28/2016   MCV 94.7 01/28/2016   PLT 195 01/28/2016   NEUTROABS 2.4 01/09/2014    ASSESSMENT & PLAN:  Monoallelic mutation of PALB2 gene Breast Cancer Surveillance:  Mammogram 10/16/16: No mammographic evidence of malignancy, Density B MRI Breasts: 10/23/16: No evidence of malignancy  Patient expressed her desire to have bilateral mastectomies.  I will send referral to Dr. Donne Hazel who will coordinate surgeries with the help of plastic surgery.   Risk of pancreatic and ovarian cancers: Unfortunately there are no screening studies that are helpful. Clinical monitoring. Her mother was also diagnosed with PALB2 mutation. Her daughter was also positive for the mutation.   I spent 25 minutes talking to the patient of which more than half was spent in counseling and coordination of care.  No orders of the defined types were placed in this encounter.  The patient has a good understanding of the overall plan. she agrees with it. she will call with any problems that may develop before the next visit here.   Harriette Ohara, MD 03/11/17

## 2017-04-01 ENCOUNTER — Encounter: Payer: Self-pay | Admitting: Family Medicine

## 2017-04-01 ENCOUNTER — Encounter: Payer: Self-pay | Admitting: Gynecology

## 2017-04-02 NOTE — Telephone Encounter (Signed)
With her mutation my understanding is that this carries a higher risk of breast cancer.  With her strong family history of breast cancer I certainly think it is reasonable to consider prophylactic mastectomies.

## 2017-04-07 DIAGNOSIS — Z1501 Genetic susceptibility to malignant neoplasm of breast: Secondary | ICD-10-CM | POA: Diagnosis not present

## 2017-04-07 DIAGNOSIS — Z803 Family history of malignant neoplasm of breast: Secondary | ICD-10-CM | POA: Diagnosis not present

## 2017-04-17 DIAGNOSIS — Z1501 Genetic susceptibility to malignant neoplasm of breast: Secondary | ICD-10-CM | POA: Diagnosis not present

## 2017-04-17 DIAGNOSIS — Z1589 Genetic susceptibility to other disease: Secondary | ICD-10-CM | POA: Diagnosis not present

## 2017-04-17 DIAGNOSIS — Z803 Family history of malignant neoplasm of breast: Secondary | ICD-10-CM | POA: Diagnosis not present

## 2017-04-17 DIAGNOSIS — Z1509 Genetic susceptibility to other malignant neoplasm: Secondary | ICD-10-CM | POA: Diagnosis not present

## 2017-05-20 NOTE — Progress Notes (Signed)
HPI:  Using dictation device. Unfortunately this device frequently misinterprets words/phrases.  Hyperlipidemia/Obesity: -higher 12/18 check - med diet, 150 min aerobic exercise and consideration medication advised -She has been trying to eat healthier.  She cut out coconut oil and has decreased dairy consumption.  She is exercising some, not quite 150 minutes a week yet. -She is frustrated with her weight -No chest pain, shortness of breath, fatigue  Seasonal allergies: -used flonase in the past  Increased Risk Breast/pancreatic/ovarian Ca/PALB2 mutation: -managed and monitored by onc and gyn for this and her increased ca risks  -has chosen annual mammo/breast MRIs - managed by her oncologist -s/p prophylactic bilat salpingo-oophorectomy -Considering prophylactic mastectomy -will be seeing plastic surgeon in [place] soon   ROS: See pertinent positives and negatives per HPI.  Past Medical History:  Diagnosis Date  . ALLERGIC RHINITIS 11/23/2007   Qualifier: Diagnosis of  By: Sherlynn Stalls, CMA, [place]    . History of colonic polyps 11/23/2007   Repeat colonoscopy advised in 2018 per GI report    . Microscopic hematuria    had work up with urologist in the past for this  . Monoallelic mutation of PALB2 gene 2017  . Radiculopathy 12/05/2011    Past Surgical History:  Procedure Laterality Date  . BARTHOLIN CYST MARSUPIALIZATION    . Bone marrow extraction    . DILATATION & CURETTAGE/HYSTEROSCOPY WITH MYOSURE N/A 01/31/2016   Procedure: DILATATION & CURETTAGE/HYSTEROSCOPY WITH MYOSURE/RESECTION OF POLYP;  Surgeon: Anastasio Auerbach, MD;  Location: [facility];  Service: Gynecology;  Laterality: N/A;  . LAPAROSCOPIC BILATERAL SALPINGO OOPHERECTOMY Bilateral 01/31/2016   Procedure: LAPAROSCOPIC BILATERAL SALPINGO OOPHORECTOMY;  Surgeon: Anastasio Auerbach, MD;  Location: [facility];  Service: Gynecology;  Laterality: Bilateral;  TO FOLLOW 7:30 CASE AROUND 9:15AM  Needs one hour OR time.   . OTOPLASTY    . TONSILLECTOMY      Family History  Problem Relation Age of Onset  . Breast cancer Mother 1  . Heart disease Mother   . Lung cancer Mother        smoker  . Breast cancer Maternal Aunt 52  . Lung cancer Maternal Aunt        smoker  . Breast cancer Maternal Grandmother 59  . Colon cancer Paternal Grandmother        dx in her 66s  . Ovarian cancer Paternal Aunt        dx in her 34s  . Leukemia Father        dx with CML in his 46s  . Colon polyps Father   . Rheumatic fever Maternal Grandfather     SOCIAL HX: See HPI   Current Outpatient Medications:  .  Calcium Citrate-Vitamin D 500-500 MG-UNIT PACK, Take 1,000 mg by mouth every morning., Disp: , Rfl:  .  cholecalciferol (VITAMIN D) 1000 units tablet, Take 1,000 Units by mouth daily., Disp: , Rfl:  .  Omega-3 Fatty Acids (FISH OIL PO), Take 1 capsule by mouth daily. , Disp: , Rfl:  .  TURMERIC PO, Take by mouth daily. , Disp: , Rfl:  .  vitamin B-12 (CYANOCOBALAMIN) 1000 MCG tablet, Take 1,000 mcg by mouth daily., Disp: , Rfl:   EXAM:  Vitals:   05/21/17 0846  BP: 90/60  Pulse: 64  Temp: 98.4 F (36.9 C)    Body mass index is 30.37 kg/m.  GENERAL: vitals reviewed and listed above, alert, oriented, appears well hydrated and in no acute distress  HEENT: atraumatic, conjunttiva clear,  no obvious abnormalities on inspection of external nose and ears  NECK: no obvious masses on inspection  LUNGS: clear to auscultation bilaterally, no wheezes, rales or rhonchi, good air movement  CV: HRRR, no peripheral edema  MS: moves all extremities without noticeable abnormality  PSYCH: pleasant and cooperative, no obvious depression or anxiety  ASSESSMENT AND PLAN:  Discussed the following assessment and plan:  Hyperlipidemia, unspecified hyperlipidemia type - Plan: Lipid panel  BMI 17.7-11.6,FBXUX  Monoallelic mutation of PALB2 gene  -Lifestyle recommendations for weight reduction discussed -We  will check fasting labs today, consideration of statin if not improving, discussed risk and benefits of these medications and she would like to go ahead and try if needed after lab check -we will try pravastatin -Follow-up 4-6 months, sooner as needed -Seeing specialist regarding the gene mutation  Patient Instructions  BEFORE YOU LEAVE: -labs -follow up: 4-6 months  We have ordered labs or studies at this visit. It can take up to 1-2 weeks for results and processing. IF results require follow up or explanation, we will call you with instructions. Clinically stable results will be released to your [facility]. If you have not heard from [place] or cannot find your results in [facility] in 2 weeks please contact our office at [phone].  If you are not yet signed up for [facility], please consider signing up.   We recommend the following healthy lifestyle for LIFE: 1) Small portions. But, make sure to get regular (at least 3 per day), healthy meals and small healthy snacks if needed.  2) Eat a healthy clean diet.   TRY TO EAT: -at least 5-7 servings of low sugar, colorful, and nutrient rich vegetables per day (not corn, potatoes or bananas.) -berries are the best choice if you wish to eat fruit (only eat small amounts if trying to reduce weight)  -lean meets (fish, white meat of chicken or [place]) -vegan proteins for some meals - beans or tofu, whole grains, nuts and seeds -Replace bad fats with good fats - good fats include: fish, nuts and seeds, canola oil, olive oil -small amounts of low fat or non fat dairy -small amounts of100 % whole grains - check the lables -drink plenty of water -fermented foods on a regular basis  AVOID: -SUGAR, sweets, anything with added sugar, corn syrup or sweeteners - must read labels as even foods advertised as "healthy" often are loaded with sugar -if you must have a sweetener, small amounts of stevia may be best -sweetened beverages and artificially sweetened  beverages -simple starches (rice, bread, potatoes, pasta, chips, etc - small amounts of 100% whole grains are ok) -red meat, pork, butter -fried foods, fast food, processed food, excessive dairy, eggs and coconut.  3)Get at least 150 minutes of sweaty aerobic exercise per week.  4)Reduce stress - consider counseling, meditation and relaxation to balance other aspects of your life.         Lucretia Kern, DO

## 2017-05-21 ENCOUNTER — Ambulatory Visit: Payer: BLUE CROSS/BLUE SHIELD | Admitting: Family Medicine

## 2017-05-21 ENCOUNTER — Encounter: Payer: Self-pay | Admitting: Family Medicine

## 2017-05-21 VITALS — BP 90/60 | HR 64 | Temp 98.4°F | Ht 66.5 in | Wt 191.0 lb

## 2017-05-21 DIAGNOSIS — Z1501 Genetic susceptibility to malignant neoplasm of breast: Secondary | ICD-10-CM

## 2017-05-21 DIAGNOSIS — Z1589 Genetic susceptibility to other disease: Secondary | ICD-10-CM | POA: Diagnosis not present

## 2017-05-21 DIAGNOSIS — E785 Hyperlipidemia, unspecified: Secondary | ICD-10-CM

## 2017-05-21 DIAGNOSIS — Z1509 Genetic susceptibility to other malignant neoplasm: Secondary | ICD-10-CM

## 2017-05-21 DIAGNOSIS — Z6830 Body mass index (BMI) 30.0-30.9, adult: Secondary | ICD-10-CM | POA: Diagnosis not present

## 2017-05-21 LAB — LIPID PANEL
CHOLESTEROL: 190 mg/dL (ref 0–200)
HDL: 45.6 mg/dL (ref >39.00)
LDL Cholesterol: 130 mg/dL — ABNORMAL HIGH (ref 0–99)
NonHDL: 144.51
TRIGLYCERIDES: 74 mg/dL (ref 0.0–149.0)
Total CHOL/HDL Ratio: 4
VLDL: 14.8 mg/dL (ref 0.0–40.0)

## 2017-05-21 NOTE — Patient Instructions (Signed)
BEFORE YOU LEAVE: -labs -follow up: 4-6 months  We have ordered labs or studies at this visit. It can take up to 1-2 weeks for results and processing. IF results require follow up or explanation, we will call you with instructions. Clinically stable results will be released to your [facility]. If you have not heard from us or cannot find your results in [facility] in 2 weeks please contact our office at [phone].  If you are not yet signed up for [facility], please consider signing up.   We recommend the following healthy lifestyle for LIFE: 1) Small portions. But, make sure to get regular (at least 3 per day), healthy meals and small healthy snacks if needed.  2) Eat a healthy clean diet.   TRY TO EAT: -at least 5-7 servings of low sugar, colorful, and nutrient rich vegetables per day (not corn, potatoes or bananas.) -berries are the best choice if you wish to eat fruit (only eat small amounts if trying to reduce weight)  -lean meets (fish, white meat of chicken or [place]) -vegan proteins for some meals - beans or tofu, whole grains, nuts and seeds -Replace bad fats with good fats - good fats include: fish, nuts and seeds, canola oil, olive oil -small amounts of low fat or non fat dairy -small amounts of100 % whole grains - check the lables -drink plenty of water -fermented foods on a regular basis  AVOID: -SUGAR, sweets, anything with added sugar, corn syrup or sweeteners - must read labels as even foods advertised as "healthy" often are loaded with sugar -if you must have a sweetener, small amounts of stevia may be best -sweetened beverages and artificially sweetened beverages -simple starches (rice, bread, potatoes, pasta, chips, etc - small amounts of 100% whole grains are ok) -red meat, pork, butter -fried foods, fast food, processed food, excessive dairy, eggs and coconut.  3)Get at least 150 minutes of sweaty aerobic exercise per week.  4)Reduce stress - consider counseling,  meditation and relaxation to balance other aspects of your life.

## 2017-06-29 DIAGNOSIS — Z1501 Genetic susceptibility to malignant neoplasm of breast: Secondary | ICD-10-CM | POA: Diagnosis not present

## 2017-06-29 DIAGNOSIS — Z803 Family history of malignant neoplasm of breast: Secondary | ICD-10-CM | POA: Diagnosis not present

## 2017-07-24 DIAGNOSIS — Z803 Family history of malignant neoplasm of breast: Secondary | ICD-10-CM | POA: Diagnosis not present

## 2017-07-24 DIAGNOSIS — Z1501 Genetic susceptibility to malignant neoplasm of breast: Secondary | ICD-10-CM | POA: Diagnosis not present

## 2017-07-24 DIAGNOSIS — Z1589 Genetic susceptibility to other disease: Secondary | ICD-10-CM | POA: Diagnosis not present

## 2017-07-24 DIAGNOSIS — Z1509 Genetic susceptibility to other malignant neoplasm: Secondary | ICD-10-CM | POA: Diagnosis not present

## 2017-10-20 NOTE — Progress Notes (Signed)
HPI:  Using dictation device. Unfortunately this device frequently misinterprets words/phrases.  Kristina Pham is a pleasant 57 y.o. here for follow up. Chronic medical problems summarized below were reviewed for changes and stability and were updated as needed below. These issues and their treatment remain stable for the most part. She did not want to check a weight today though as has been traveling and diet was not as good. Had been trying to maintain a healthier diet and regular activity when home.Also opted to check cholesterol next visit instead of today. New issue of rash on face. Just started in the last few days after returning from visit to her daughter. Daughter and daughter's pets had reingworm and she feel this is likely ringworm. No pruritis or pain, oozing. She tried to be really careful and pets and her daughter were on treatment. She plans to treat it with lotrimin. No reported CP, SOB, DOE, treatment intolerance or new symptoms.  Due for flu shot  Hyperlipidemia/Obesity: -cholesterol improved with cutting out coconut oil -wt 191 4/19 --> declined recheck today as has been on vacation and did not eat as well -No chest pain, shortness of breath, fatigue  Seasonal allergies: -used flonase in the past  Increased Risk Breast/pancreatic/ovarian Ca/PALB2 mutation: -managed and monitored by onc and gynfor this and her increased ca risks -has chosen annual mammo/breast MRIs - managed by her oncologist -s/p prophylactic bilat salpingo-oophorectomy -Considering prophylactic mastectomy -will be seeing plastic surgeon in [place] soon   ROS: See pertinent positives and negatives per HPI.  Past Medical History:  Diagnosis Date  . ALLERGIC RHINITIS 11/23/2007   Qualifier: Diagnosis of  By: Sherlynn Stalls, CMA, [place]    . History of colonic polyps 11/23/2007   Repeat colonoscopy advised in 2018 per GI report    . Microscopic hematuria    had work up with urologist in the past for  this  . Monoallelic mutation of PALB2 gene 2017  . Radiculopathy 12/05/2011    Past Surgical History:  Procedure Laterality Date  . BARTHOLIN CYST MARSUPIALIZATION    . Bone marrow extraction    . DILATATION & CURETTAGE/HYSTEROSCOPY WITH MYOSURE N/A 01/31/2016   Procedure: DILATATION & CURETTAGE/HYSTEROSCOPY WITH MYOSURE/RESECTION OF POLYP;  Surgeon: Anastasio Auerbach, MD;  Location: [facility];  Service: Gynecology;  Laterality: N/A;  . LAPAROSCOPIC BILATERAL SALPINGO OOPHERECTOMY Bilateral 01/31/2016   Procedure: LAPAROSCOPIC BILATERAL SALPINGO OOPHORECTOMY;  Surgeon: Anastasio Auerbach, MD;  Location: [facility];  Service: Gynecology;  Laterality: Bilateral;  TO FOLLOW 7:30 CASE AROUND 9:15AM  Needs one hour OR time.  . OTOPLASTY    . TONSILLECTOMY      Family History  Problem Relation Age of Onset  . Breast cancer Mother 78  . Heart disease Mother   . Lung cancer Mother        smoker  . Breast cancer Maternal Aunt 52  . Lung cancer Maternal Aunt        smoker  . Breast cancer Maternal Grandmother 23  . Colon cancer Paternal Grandmother        dx in her 59s  . Ovarian cancer Paternal Aunt        dx in her 62s  . Leukemia Father        dx with CML in his 98s  . Colon polyps Father   . Rheumatic fever Maternal Grandfather     SOCIAL HX: see hpi   Current Outpatient Medications:  .  Calcium Citrate-Vitamin D 500-500 MG-UNIT PACK, Take  1,000 mg by mouth every morning., Disp: , Rfl:  .  cholecalciferol (VITAMIN D) 1000 units tablet, Take 1,000 Units by mouth daily., Disp: , Rfl:  .  Omega-3 Fatty Acids (FISH OIL PO), Take 1 capsule by mouth daily. , Disp: , Rfl:  .  TURMERIC PO, Take by mouth daily. , Disp: , Rfl:  .  vitamin B-12 (CYANOCOBALAMIN) 1000 MCG tablet, Take 1,000 mcg by mouth daily., Disp: , Rfl:   EXAM:  Vitals:   10/22/17 0804  BP: 100/60  Pulse: 64  Temp: 98.3 F (36.8 C)    Body mass index is 30.37 kg/m.  GENERAL: vitals reviewed and listed above,  alert, oriented, appears well hydrated and in no acute distress  HEENT: atraumatic, conjunttiva clear, no obvious abnormalities on inspection of external nose and ears  NECK: no obvious masses on inspection  LUNGS: clear to auscultation bilaterally, no wheezes, rales or rhonchi, good air movement  CV: HRRR, no peripheral edema  MS: moves all extremities without noticeable abnormality  SKIN: 3 small papules, one excoriated on face (below R eye, chin)  PSYCH: pleasant and cooperative, no obvious depression or anxiety  ASSESSMENT AND PLAN:  Discussed the following assessment and plan:  Skin lesion of face -while these do not look like fungal rash on exam today (appear more like insect bite) we discussed treatment of ringworm given her concerns and she has decided to use lotrimin topical and monitor -advised follow up if worsening or persists  Hyperlipidemia, unspecified hyperlipidemia type/Obesity (she declined wt  Check today) -lifestyle recs, opted to hold off on checking as she would not want to start medication if off today and check after being home/working on lifestyle  Need for immunization against influenza - Plan: Flu Vaccine QUAD 6+ mos PF IM (Fluarix Quad PF) -discussed flu vaccine and she wants to do today  -Patient advised to return or notify a doctor immediately if symptoms worsen or persist or new concerns arise.  Patient Instructions  BEFORE YOU LEAVE: -flu shot -follow up: 3 months  Lotrimin twice daily for 3 -4 weeks. Follow up if worsening or not resolving with treatment.   We recommend the following healthy lifestyle for LIFE: 1) Small portions. But, make sure to get regular (at least 3 per day), healthy meals and small healthy snacks if needed.  2) Eat a healthy clean diet.   TRY TO EAT: -at least 5-7 servings of low sugar, colorful, and nutrient rich vegetables per day (not corn, potatoes or bananas.) -berries are the best choice if you wish to eat  fruit (only eat small amounts if trying to reduce weight)  -lean meets (fish, white meat of chicken or [place]) -vegan proteins for some meals - beans or tofu, whole grains, nuts and seeds -Replace bad fats with good fats - good fats include: fish, nuts and seeds, canola oil, olive oil -small amounts of low fat or non fat dairy -small amounts of100 % whole grains - check the lables -drink plenty of water  AVOID: -SUGAR, sweets, anything with added sugar, corn syrup or sweeteners - must read labels as even foods advertised as "healthy" often are loaded with sugar -if you must have a sweetener, small amounts of stevia may be best -sweetened beverages and artificially sweetened beverages -simple starches (rice, bread, potatoes, pasta, chips, etc - small amounts of 100% whole grains are ok) -red meat, pork, butter -fried foods, fast food, processed food, excessive dairy, eggs and coconut.  3)Get at least 150  minutes of sweaty aerobic exercise per week.  4)Reduce stress - consider counseling, meditation and relaxation to balance other aspects of your life.     Lucretia Kern, DO

## 2017-10-22 ENCOUNTER — Ambulatory Visit: Payer: BLUE CROSS/BLUE SHIELD | Admitting: Family Medicine

## 2017-10-22 ENCOUNTER — Encounter: Payer: Self-pay | Admitting: Family Medicine

## 2017-10-22 VITALS — BP 100/60 | HR 64 | Temp 98.3°F | Ht 66.5 in

## 2017-10-22 DIAGNOSIS — E785 Hyperlipidemia, unspecified: Secondary | ICD-10-CM | POA: Diagnosis not present

## 2017-10-22 DIAGNOSIS — Z23 Encounter for immunization: Secondary | ICD-10-CM | POA: Diagnosis not present

## 2017-10-22 DIAGNOSIS — L989 Disorder of the skin and subcutaneous tissue, unspecified: Secondary | ICD-10-CM | POA: Diagnosis not present

## 2017-10-22 NOTE — Patient Instructions (Signed)
BEFORE YOU LEAVE: -flu shot -follow up: 3 months  Lotrimin twice daily for 3 -4 weeks. Follow up if worsening or not resolving with treatment.   We recommend the following healthy lifestyle for LIFE: 1) Small portions. But, make sure to get regular (at least 3 per day), healthy meals and small healthy snacks if needed.  2) Eat a healthy clean diet.   TRY TO EAT: -at least 5-7 servings of low sugar, colorful, and nutrient rich vegetables per day (not corn, potatoes or bananas.) -berries are the best choice if you wish to eat fruit (only eat small amounts if trying to reduce weight)  -lean meets (fish, white meat of chicken or [place]) -vegan proteins for some meals - beans or tofu, whole grains, nuts and seeds -Replace bad fats with good fats - good fats include: fish, nuts and seeds, canola oil, olive oil -small amounts of low fat or non fat dairy -small amounts of100 % whole grains - check the lables -drink plenty of water  AVOID: -SUGAR, sweets, anything with added sugar, corn syrup or sweeteners - must read labels as even foods advertised as "healthy" often are loaded with sugar -if you must have a sweetener, small amounts of stevia may be best -sweetened beverages and artificially sweetened beverages -simple starches (rice, bread, potatoes, pasta, chips, etc - small amounts of 100% whole grains are ok) -red meat, pork, butter -fried foods, fast food, processed food, excessive dairy, eggs and coconut.  3)Get at least 150 minutes of sweaty aerobic exercise per week.  4)Reduce stress - consider counseling, meditation and relaxation to balance other aspects of your life.

## 2017-11-24 ENCOUNTER — Ambulatory Visit: Payer: BLUE CROSS/BLUE SHIELD | Admitting: Family Medicine

## 2017-11-24 ENCOUNTER — Encounter: Payer: Self-pay | Admitting: Family Medicine

## 2017-11-24 VITALS — BP 98/62 | HR 62 | Temp 98.2°F | Ht 66.5 in | Wt 182.0 lb

## 2017-11-24 DIAGNOSIS — B354 Tinea corporis: Secondary | ICD-10-CM | POA: Diagnosis not present

## 2017-11-24 DIAGNOSIS — B350 Tinea barbae and tinea capitis: Secondary | ICD-10-CM | POA: Diagnosis not present

## 2017-11-24 LAB — COMPREHENSIVE METABOLIC PANEL
ALT: 17 U/L (ref 0–35)
AST: 17 U/L (ref 0–37)
Albumin: 4.7 g/dL (ref 3.5–5.2)
Alkaline Phosphatase: 54 U/L (ref 39–117)
BUN: 15 mg/dL (ref 6–23)
CALCIUM: 9.7 mg/dL (ref 8.4–10.5)
CO2: 30 meq/L (ref 19–32)
Chloride: 103 mEq/L (ref 96–112)
Creatinine, Ser: 0.78 mg/dL (ref 0.40–1.20)
GFR: 80.72 mL/min (ref >60.00)
GLUCOSE: 93 mg/dL (ref 70–99)
POTASSIUM: 4.6 meq/L (ref 3.5–5.1)
Sodium: 141 mEq/L (ref 135–145)
Total Bilirubin: 0.5 mg/dL (ref 0.2–1.2)
Total Protein: 6.8 g/dL (ref 6.0–8.3)

## 2017-11-24 MED ORDER — KETOCONAZOLE 2 % EX SHAM: 1.0000 "application " | MEDICATED_SHAMPOO | CUTANEOUS | 1 refills | Status: DC

## 2017-11-24 MED ORDER — TERBINAFINE HCL 250 MG PO TABS: 250.0000 mg | ORAL_TABLET | Freq: Every day | ORAL | 0 refills | Status: DC

## 2017-11-24 NOTE — Progress Notes (Addendum)
HPI:  Using dictation device. Unfortunately this device frequently misinterprets words/phrases.  Acute visit for skin rash: -post scalp, itchy -has been dealing with tinea corporis from family member - treating with topical azole and spots eventually clear, but now lesion on scalp -daughter required oral antifungal, she is not sure of medication  -cat with fungal infection, now cured  ROS: See pertinent positives and negatives per HPI.  Past Medical History:  Diagnosis Date  . ALLERGIC RHINITIS 11/23/2007   Qualifier: Diagnosis of  By: Sherlynn Stalls, CMA, [place]    . History of colonic polyps 11/23/2007   Repeat colonoscopy advised in 2018 per GI report    . Microscopic hematuria    had work up with urologist in the past for this  . Monoallelic mutation of PALB2 gene 2017  . Radiculopathy 12/05/2011    Past Surgical History:  Procedure Laterality Date  . BARTHOLIN CYST MARSUPIALIZATION    . Bone marrow extraction    . DILATATION & CURETTAGE/HYSTEROSCOPY WITH MYOSURE N/A 01/31/2016   Procedure: DILATATION & CURETTAGE/HYSTEROSCOPY WITH MYOSURE/RESECTION OF POLYP;  Surgeon: Anastasio Auerbach, MD;  Location: [facility];  Service: Gynecology;  Laterality: N/A;  . LAPAROSCOPIC BILATERAL SALPINGO OOPHERECTOMY Bilateral 01/31/2016   Procedure: LAPAROSCOPIC BILATERAL SALPINGO OOPHORECTOMY;  Surgeon: Anastasio Auerbach, MD;  Location: [facility];  Service: Gynecology;  Laterality: Bilateral;  TO FOLLOW 7:30 CASE AROUND 9:15AM  Needs one hour OR time.  . OTOPLASTY    . TONSILLECTOMY      Family History  Problem Relation Age of Onset  . Breast cancer Mother 63  . Heart disease Mother   . Lung cancer Mother        smoker  . Breast cancer Maternal Aunt 52  . Lung cancer Maternal Aunt        smoker  . Breast cancer Maternal Grandmother 2  . Colon cancer Paternal Grandmother        dx in her 66s  . Ovarian cancer Paternal Aunt        dx in her 46s  . Leukemia Father        dx with CML in his  52s  . Colon polyps Father   . Rheumatic fever Maternal Grandfather     SOCIAL HX: see hpi   Current Outpatient Medications:  .  Calcium Citrate-Vitamin D 500-500 MG-UNIT PACK, Take 1,000 mg by mouth every morning., Disp: , Rfl:  .  cholecalciferol (VITAMIN D) 1000 units tablet, Take 1,000 Units by mouth daily., Disp: , Rfl:  .  Omega-3 Fatty Acids (FISH OIL PO), Take 1 capsule by mouth daily. , Disp: , Rfl:  .  TURMERIC PO, Take by mouth daily. , Disp: , Rfl:  .  vitamin B-12 (CYANOCOBALAMIN) 1000 MCG tablet, Take 1,000 mcg by mouth daily., Disp: , Rfl:  .  [START ON 11/26/2017] ketoconazole (NIZORAL) 2 % shampoo, Apply 1 application topically 2 (two) times a week., Disp: 120 mL, Rfl: 1 .  terbinafine (LAMISIL) 250 MG tablet, Take 1 tablet (250 mg total) by mouth daily., Disp: 45 tablet, Rfl: 0  EXAM:  Vitals:   11/24/17 1439  BP: 98/62  Pulse: 62  Temp: 98.2 F (36.8 C)    Body mass index is 28.94 kg/m.  GENERAL: vitals reviewed and listed above, alert, oriented, appears well hydrated and in no acute distress  HEENT: atraumatic, conjunttiva clear, no obvious abnormalities on inspection of external nose and ears  NECK: no obvious masses on inspection  SKIN: patch of  erythematous mildly scally skin with central clearing and hair loss on post scalp, several patches erythematous skin with scaly border and central clearing elsewhere - neck, leg, arm  MS: moves all extremities without noticeable abnormality  PSYCH: pleasant and cooperative, no obvious depression or anxiety  ASSESSMENT AND PLAN:  Discussed the following assessment and plan: More than 50% of over 25 minutes spent in total in caring for this patient was spent face-to-face with the patient, counseling and/or coordinating care.    Tinea capitis - Plan: Comprehensive metabolic panel  Tinea corporis  -discussed various options and risks -she opted to try terbinafine, check cmp first -follow up 6 weeks,  sooner as needed -antifungal shampoo for her and family members twice weekly -advised vet check cat as well to ensure clearance -Patient advised to return or notify a doctor immediately if symptoms worsen or new concerns arise.  Patient Instructions  BEFORE YOU LEAVE: -lab -follow up: as scheduled in December  Start the terbinafine and take once daily.  Use the shampoo twice per week and have household contacts use as well.  Make sure cat is checked with vet on regular basis to ensure not recurring.    Lucretia Kern, DO

## 2017-11-24 NOTE — Patient Instructions (Signed)
BEFORE YOU LEAVE: -lab -follow up: as scheduled in December  Start the terbinafine and take once daily.  Use the shampoo twice per week and have household contacts use as well.  Make sure cat is checked with vet on regular basis to ensure not recurring.

## 2017-11-25 ENCOUNTER — Other Ambulatory Visit: Payer: Self-pay | Admitting: Hematology and Oncology

## 2017-11-25 DIAGNOSIS — Z1231 Encounter for screening mammogram for malignant neoplasm of breast: Secondary | ICD-10-CM

## 2017-12-07 ENCOUNTER — Ambulatory Visit
Admission: RE | Admit: 2017-12-07 | Discharge: 2017-12-07 | Disposition: A | Discharge status: Home / Self Care | Payer: BLUE CROSS/BLUE SHIELD | Source: Ambulatory Visit | Attending: Hematology and Oncology | Admitting: Hematology and Oncology

## 2017-12-07 DIAGNOSIS — Z1231 Encounter for screening mammogram for malignant neoplasm of breast: Secondary | ICD-10-CM | POA: Diagnosis not present

## 2017-12-14 ENCOUNTER — Encounter: Payer: Self-pay | Admitting: Hematology and Oncology

## 2017-12-30 ENCOUNTER — Encounter: Payer: Self-pay | Admitting: Gynecology

## 2017-12-30 ENCOUNTER — Ambulatory Visit: Payer: BLUE CROSS/BLUE SHIELD | Admitting: Gynecology

## 2017-12-30 VITALS — BP 118/76 | Ht 65.0 in | Wt 185.0 lb

## 2017-12-30 DIAGNOSIS — Z01419 Encounter for gynecological examination (general) (routine) without abnormal findings: Secondary | ICD-10-CM

## 2017-12-30 NOTE — Patient Instructions (Signed)
Follow-up in 1 year for annual gynecologic exam, sooner if any issues. 

## 2017-12-30 NOTE — Progress Notes (Signed)
    Kristina Gibneyatricia Pham May 16, 1960 [id number]        57 y.o.  G2P2002 for annual gynecologic exam.  Without gynecologic complaints  Past medical history,surgical history, problem list, medications, allergies, family history and social history were all reviewed and documented as reviewed in the EPIC chart.  ROS:  Performed with pertinent positives and negatives included in the history, assessment and plan.   Additional significant findings : None   Exam: Kennon PortelaKim Gardner assistant Vitals:   12/30/17 1155  BP: 118/76  Weight: 185 lb (83.9 kg)  Height: 5\' 5"  (1.651 m)   Body mass index is 30.79 kg/m.  General appearance:  Normal affect, orientation and appearance. Skin: Grossly normal HEENT: Without gross lesions.  No cervical or supraclavicular adenopathy. Thyroid normal.  Lungs:  Clear without wheezing, rales or rhonchi Cardiac: RR, without RMG Abdominal:  Soft, nontender, without masses, guarding, rebound, organomegaly or hernia Breasts:  Examined lying and sitting without masses, retractions, discharge or axillary adenopathy. Pelvic:  Ext, BUS, Vagina: Normal with mild atrophic changes  Cervix: Normal with mild atrophic changes.  Pap smear done  Uterus: Anteverted, normal size, shape and contour, midline and mobile nontender   Adnexa: Without masses or tenderness    Anus and perineum: Normal   Rectovaginal: Normal sphincter tone without palpated masses or tenderness.    Assessment/Plan:  57 y.o. 262P2002 female for annual gynecologic exam.   1. Postmenopausal.  No significant menopausal symptoms or any vaginal bleeding. 2. History of PABL2 mutation with high risk of breast cancer.  Underwent laparoscopic BSO 2017 [facility]  Mammography 11/2017-.  Exam NED. 3. Pap smear 2016.  Pap smear done today.  No history of significant abnormal Pap smears. 4. Colonoscopy 2017.  Repeat at their recommended interval. 5. DEXA never.  Will plan at age  57. 856. Health maintenance.  No routine lab work done as patient does this elsewhere.  Follow-up 1 year, sooner as needed.   Dara Lordsimothy P Kanika Bungert MD, 12:15 PM 12/30/2017

## 2017-12-30 NOTE — Addendum Note (Signed)
Addended by: Dayna BarkerGARDNER, KIMBERLY K on: 12/30/2017 12:20 PM   Modules accepted: Orders

## 2018-01-01 LAB — PAP IG W/ RFLX HPV ASCU

## 2018-01-25 ENCOUNTER — Encounter: Payer: BLUE CROSS/BLUE SHIELD | Admitting: Family Medicine

## 2018-01-25 DIAGNOSIS — Z0289 Encounter for other administrative examinations: Secondary | ICD-10-CM

## 2018-02-05 DIAGNOSIS — Z1589 Genetic susceptibility to other disease: Secondary | ICD-10-CM | POA: Diagnosis not present

## 2018-02-05 DIAGNOSIS — Z1509 Genetic susceptibility to other malignant neoplasm: Secondary | ICD-10-CM | POA: Diagnosis not present

## 2018-02-05 DIAGNOSIS — Z1501 Genetic susceptibility to malignant neoplasm of breast: Secondary | ICD-10-CM | POA: Diagnosis not present

## 2018-02-05 DIAGNOSIS — Z803 Family history of malignant neoplasm of breast: Secondary | ICD-10-CM | POA: Diagnosis not present

## 2018-02-13 NOTE — Progress Notes (Addendum)
HPI:  Using dictation device. Unfortunately this device frequently misinterprets words/phrases.  Here for CPE: Due for lipid panel, ? STI screen, ? Skin check  -Concerns and/or follow up today:   Chronic medical problems summarized below were reviewed for changes. PMH PALB2 mutation. Sees oncology for increased risk cancer. Sees gyn for breast, women's health. S/p lap BSO, planning prophylactic mastectomy in the new year.  Cholesterol was improving 05/2017 and she was to continue healthy lifestyle - recheck today.  Skin lesions healed she thinks with terbinafine. Can't see back of scalp where worst lesion was. Had one shingles vaccine. She is waiting until after mastectomy to do the second.  -Diet: variety of foods, balance and well rounded, larger portion sizes -Exercise: no regular exercise -Taking folic acid, vitamin D or calcium: no -Diabetes and Dyslipidemia Screening: fasting -Vaccines: see vaccine section EPIC -pap history: sees gyn, pap done 12/2017 -FDLMP: see nursing notes -sexual activity: not discussed -wants STI testing (Hep C if born 60-65): no, declined -FH breast, colon or ovarian ca: see FH Last mammogram: 11/2017 with oncology Last colon cancer screening: 11/2015 with 10 year repeat advised per notes in epic Breast Ca Risk Assessment: see family history and pt history DEXA (>/= 65): n/a  -Alcohol, Tobacco, drug use: see social history  Review of Systems - no fevers, unintentional weight loss, vision loss, hearing loss, chest pain, sob, hemoptysis, melena, hematochezia, hematuria, genital discharge, changing or concerning skin lesions, bleeding, bruising, loc, thoughts of self harm or SI  Past Medical History:  Diagnosis Date  . ALLERGIC RHINITIS 11/23/2007   Qualifier: Diagnosis of  By: Sherlynn Stalls, CMA, [place]    . History of colonic polyps 11/23/2007   Repeat colonoscopy advised in 2018 per GI report    . Microscopic hematuria    had work up with urologist in  the past for this  . Monoallelic mutation of PALB2 gene 2017  . Radiculopathy 12/05/2011    Past Surgical History:  Procedure Laterality Date  . BARTHOLIN CYST MARSUPIALIZATION    . Bone marrow extraction    . DILATATION & CURETTAGE/HYSTEROSCOPY WITH MYOSURE N/A 01/31/2016   Procedure: DILATATION & CURETTAGE/HYSTEROSCOPY WITH MYOSURE/RESECTION OF POLYP;  Surgeon: Anastasio Auerbach, MD;  Location: [facility];  Service: Gynecology;  Laterality: N/A;  . LAPAROSCOPIC BILATERAL SALPINGO OOPHERECTOMY Bilateral 01/31/2016   Procedure: LAPAROSCOPIC BILATERAL SALPINGO OOPHORECTOMY;  Surgeon: Anastasio Auerbach, MD;  Location: [facility];  Service: Gynecology;  Laterality: Bilateral;  TO FOLLOW 7:30 CASE AROUND 9:15AM  Needs one hour OR time.  . OTOPLASTY    . TONSILLECTOMY      Family History  Problem Relation Age of Onset  . Breast cancer Mother 24  . Heart disease Mother   . Lung cancer Mother        smoker  . Breast cancer Maternal Aunt 52  . Lung cancer Maternal Aunt        smoker  . Breast cancer Maternal Grandmother 69  . Colon cancer Paternal Grandmother        dx in her 89s  . Ovarian cancer Paternal Aunt        dx in her 86s  . Leukemia Father        dx with CML in his 60s  . Colon polyps Father   . Rheumatic fever Maternal Grandfather     Social History   Socioeconomic History  . Marital status: Married    Spouse name: Not on file  . Number of children: 2  .  Years of education: Not on file  . Highest education level: Not on file  Occupational History  . Not on file  Social Needs  . Financial resource strain: Not on file  . Food insecurity:    Worry: Not on file    Inability: Not on file  . Transportation needs:    Medical: Not on file    Non-medical: Not on file  Tobacco Use  . Smoking status: Former Smoker    Packs/day: 1.00    Types: Cigarettes, E-cigarettes    Last attempt to quit: 12/04/1996    Years since quitting: 21.2  . Smokeless tobacco: Never Used   Substance and Sexual Activity  . Alcohol use: Yes    Alcohol/week: 2.0 standard drinks    Types: 2 Standard drinks or equivalent per week    Comment: OCCASIONALLY  . Drug use: No  . Sexual activity: Yes    Birth control/protection: Condom    Comment: 1st intercourse 57 yo-More than 5 partners  Lifestyle  . Physical activity:    Days per week: Not on file    Minutes per session: Not on file  . Stress: Not on file  Relationships  . Social connections:    Talks on phone: Not on file    Gets together: Not on file    Attends religious service: Not on file    Active member of club or organization: Not on file    Attends meetings of clubs or organizations: Not on file    Relationship status: Not on file  Other Topics Concern  . Not on file  Social History Narrative  . Not on file     Current Outpatient Medications:  .  Calcium Citrate-Vitamin D 500-500 MG-UNIT PACK, Take 1,000 mg by mouth every morning., Disp: , Rfl:  .  cholecalciferol (VITAMIN D) 1000 units tablet, Take 1,000 Units by mouth daily., Disp: , Rfl:  .  Omega-3 Fatty Acids (FISH OIL PO), Take 1 capsule by mouth daily. , Disp: , Rfl:  .  TURMERIC PO, Take by mouth daily. , Disp: , Rfl:  .  vitamin B-12 (CYANOCOBALAMIN) 1000 MCG tablet, Take 1,000 mcg by mouth daily., Disp: , Rfl:   EXAM:  Vitals:   02/15/18 0830  BP: 90/70  Pulse: 67  Temp: 98.3 F (36.8 C)    GENERAL: vitals reviewed and listed below, alert, oriented, appears well hydrated and in no acute distress  HEENT: head atraumatic, PERRLA, normal appearance of eyes, ears, nose and mouth. moist mucus membranes.  NECK: supple, no masses or lymphadenopathy  LUNGS: clear to auscultation bilaterally, no rales, rhonchi or wheeze  CV: HRRR, no peripheral edema or cyanosis, normal pedal pulses  ABDOMEN: bowel sounds normal, soft, non tender to palpation, no masses, no rebound or guarding  GU/BREAST: declined, does with specialist  SKIN: no rash or  abnormal lesions except mildly hypopig skin post scalp with hair growth returning  MS: normal gait, moves all extremities normally  NEURO: normal gait, speech and thought processing grossly intact, muscle tone grossly intact throughout  PSYCH: normal affect, pleasant and cooperative  ASSESSMENT AND PLAN:  Discussed the following assessment and plan:  PREVENTIVE EXAM: -Discussed and advised all [place] preventive services health task force level A and B recommendations for age, sex and risks. -Advised at least 150 minutes of exercise per week and a healthy diet with avoidance of (less then 1 serving per week) processed foods, white starches, red meat, fast foods and sweets and consisting  of: * 5-9 servings of fresh fruits and vegetables (not corn or potatoes) *nuts and seeds, beans *olives and olive oil *lean meats such as fish and white chicken  *whole grains -labs, studies and vaccines per orders this encounter -advised recheck scalp with dermatologist or here in 1-2 months   Patient advised to return to clinic immediately if symptoms worsen or persist or new concerns.  Patient Instructions  BEFORE YOU LEAVE: -labs -follow up: here or with dermatology in 1-2 months to recheck scalp; 6 month follow up for cholesterol  We have ordered labs or studies at this visit. It can take up to 1-2 weeks for results and processing. IF results require follow up or explanation, we will call you with instructions. Clinically stable results will be released to your [facility]. If you have not heard from [place] or cannot find your results in [facility] in 2 weeks please contact our office at [phone].  If you are not yet signed up for [facility], please consider signing up.   We recommend the following healthy lifestyle for LIFE: 1) Small portions. But, make sure to get regular (at least 3 per day), healthy meals and small healthy snacks if needed.  2) Eat a healthy clean diet.   TRY TO EAT: -at least 5-7  servings of low sugar, colorful, and nutrient rich vegetables per day (not corn, potatoes or bananas.) -berries are the best choice if you wish to eat fruit (only eat small amounts if trying to reduce weight)  -lean meets (fish, white meat of chicken or [place]) -vegan proteins for some meals - beans or tofu, whole grains, nuts and seeds -Replace bad fats with good fats - good fats include: fish, nuts and seeds, canola oil, olive oil -small amounts of low fat or non fat dairy -small amounts of100 % whole grains - check the lables -drink plenty of water  AVOID: -SUGAR, sweets, anything with added sugar, corn syrup or sweeteners - must read labels as even foods advertised as "healthy" often are loaded with sugar -if you must have a sweetener, small amounts of stevia may be best -sweetened beverages and artificially sweetened beverages -simple starches (rice, bread, potatoes, pasta, chips, etc - small amounts of 100% whole grains are ok) -red meat, pork, butter -fried foods, fast food, processed food, excessive dairy, eggs and coconut.  3)Get at least 150 minutes of sweaty aerobic exercise per week.  4)Reduce stress - consider counseling, meditation and relaxation to balance other aspects of your life.    Preventive Care 40-64 Years, Female Preventive care refers to lifestyle choices and visits with your health care provider that can promote health and wellness. What does preventive care include?   A yearly physical exam. This is also called an annual well check.  Dental exams once or twice a year.  Routine eye exams. Ask your health care provider how often you should have your eyes checked.  Personal lifestyle choices, including: ? Daily care of your teeth and gums. ? Regular physical activity. ? Eating a healthy diet. ? Avoiding tobacco and drug use. ? Limiting alcohol use. ? Practicing safe sex. ? Taking vitamin and mineral supplements as recommended by your health care  provider. What happens during an annual well check? The services and screenings done by your health care provider during your annual well check will depend on your age, overall health, lifestyle risk factors, and family history of disease. Counseling Your health care provider may ask you questions about your:  Alcohol use.  Tobacco use.  Drug use.  Emotional well-being.  Home and relationship well-being.  Sexual activity.  Eating habits.  Work and work Statistician.  Method of birth control.  Menstrual cycle.  Pregnancy history. Screening You may have the following tests or measurements:  Height, weight, and BMI.  Blood pressure.  Lipid and cholesterol levels. These may be checked every 5 years, or more frequently if you are over 31 years old.  Skin check.  Lung cancer screening. You may have this screening every year starting at age 53 if you have a 30-pack-year history of smoking and currently smoke or have quit within the past 15 years.  Colorectal cancer screening. All adults should have this screening starting at age 11 and continuing until age 21. Your health care provider may recommend screening at age 63. You will have tests every 1-10 years, depending on your results and the type of screening test. People at increased risk should start screening at an earlier age. Screening tests may include: ? Guaiac-based fecal occult blood testing. ? Fecal immunochemical test (FIT). ? Stool DNA test. ? Virtual colonoscopy. ? Sigmoidoscopy. During this test, a flexible tube with a tiny camera (sigmoidoscope) is used to examine your rectum and lower colon. The sigmoidoscope is inserted through your anus into your rectum and lower colon. ? Colonoscopy. During this test, a long, thin, flexible tube with a tiny camera (colonoscope) is used to examine your entire colon and rectum.  Hepatitis C blood test.  Hepatitis B blood test.  Sexually transmitted disease (STD)  testing.  Diabetes screening. This is done by checking your blood sugar (glucose) after you have not eaten for a while (fasting). You may have this done every 1-3 years.  Mammogram. This may be done every 1-2 years. Talk to your health care provider about when you should start having regular mammograms. This may depend on whether you have a family history of breast cancer.  BRCA-related cancer screening. This may be done if you have a family history of breast, ovarian, tubal, or peritoneal cancers.  Pelvic exam and Pap test. This may be done every 3 years starting at age 27. Starting at age 21, this may be done every 5 years if you have a Pap test in combination with an HPV test.  Bone density scan. This is done to screen for osteoporosis. You may have this scan if you are at high risk for osteoporosis. Discuss your test results, treatment options, and if necessary, the need for more tests with your health care provider. Vaccines Your health care provider may recommend certain vaccines, such as:  Influenza vaccine. This is recommended every year.  Tetanus, diphtheria, and acellular pertussis (Tdap, Td) vaccine. You may need a Td booster every 10 years.  Varicella vaccine. You may need this if you have not been vaccinated.  Zoster vaccine. You may need this after age 24.  Measles, mumps, and rubella (MMR) vaccine. You may need at least one dose of MMR if you were born in 1957 or later. You may also need a second dose.  Pneumococcal 13-valent conjugate (PCV13) vaccine. You may need this if you have certain conditions and were not previously vaccinated.  Pneumococcal polysaccharide (PPSV23) vaccine. You may need one or two doses if you smoke cigarettes or if you have certain conditions.  Meningococcal vaccine. You may need this if you have certain conditions.  Hepatitis A vaccine. You may need this if you have certain conditions or if you travel or work in  places where you may be exposed  to hepatitis A.  Hepatitis B vaccine. You may need this if you have certain conditions or if you travel or work in places where you may be exposed to hepatitis B.  Haemophilus influenzae type b (Hib) vaccine. You may need this if you have certain conditions. Talk to your health care provider about which screenings and vaccines you need and how often you need them. This information is not intended to replace advice given to you by your health care provider. Make sure you discuss any questions you have with your health care provider. Document Released: 03/02/2015 Document Revised: 03/26/2017 Document Reviewed: 12/05/2014 Elsevier Interactive Patient Education  2019 [employer].         No follow-ups on file.  Lucretia Kern, DO

## 2018-02-15 ENCOUNTER — Ambulatory Visit (INDEPENDENT_AMBULATORY_CARE_PROVIDER_SITE_OTHER): Payer: BLUE CROSS/BLUE SHIELD | Admitting: Family Medicine

## 2018-02-15 ENCOUNTER — Encounter: Payer: Self-pay | Admitting: Family Medicine

## 2018-02-15 VITALS — BP 90/70 | HR 67 | Temp 98.3°F | Ht 65.5 in | Wt 191.4 lb

## 2018-02-15 DIAGNOSIS — E785 Hyperlipidemia, unspecified: Secondary | ICD-10-CM

## 2018-02-15 DIAGNOSIS — Z0000 Encounter for general adult medical examination without abnormal findings: Secondary | ICD-10-CM

## 2018-02-15 DIAGNOSIS — L989 Disorder of the skin and subcutaneous tissue, unspecified: Secondary | ICD-10-CM | POA: Diagnosis not present

## 2018-02-15 LAB — LIPID PANEL
CHOL/HDL RATIO: 4
Cholesterol: 208 mg/dL — ABNORMAL HIGH (ref 0–200)
HDL: 53.5 mg/dL (ref >39.00)
LDL Cholesterol: 138 mg/dL — ABNORMAL HIGH (ref 0–99)
NonHDL: 154.84
TRIGLYCERIDES: 84 mg/dL (ref 0.0–149.0)
VLDL: 16.8 mg/dL (ref 0.0–40.0)

## 2018-02-15 NOTE — Patient Instructions (Signed)
BEFORE YOU LEAVE: -labs -follow up: here or with dermatology in 1-2 months to recheck scalp; 6 month follow up for cholesterol  We have ordered labs or studies at this visit. It can take up to 1-2 weeks for results and processing. IF results require follow up or explanation, we will call you with instructions. Clinically stable results will be released to your [facility]. If you have not heard from [place] or cannot find your results in [facility] in 2 weeks please contact our office at [phone].  If you are not yet signed up for [facility], please consider signing up.   We recommend the following healthy lifestyle for LIFE: 1) Small portions. But, make sure to get regular (at least 3 per day), healthy meals and small healthy snacks if needed.  2) Eat a healthy clean diet.   TRY TO EAT: -at least 5-7 servings of low sugar, colorful, and nutrient rich vegetables per day (not corn, potatoes or bananas.) -berries are the best choice if you wish to eat fruit (only eat small amounts if trying to reduce weight)  -lean meets (fish, white meat of chicken or [place]) -vegan proteins for some meals - beans or tofu, whole grains, nuts and seeds -Replace bad fats with good fats - good fats include: fish, nuts and seeds, canola oil, olive oil -small amounts of low fat or non fat dairy -small amounts of100 % whole grains - check the lables -drink plenty of water  AVOID: -SUGAR, sweets, anything with added sugar, corn syrup or sweeteners - must read labels as even foods advertised as "healthy" often are loaded with sugar -if you must have a sweetener, small amounts of stevia may be best -sweetened beverages and artificially sweetened beverages -simple starches (rice, bread, potatoes, pasta, chips, etc - small amounts of 100% whole grains are ok) -red meat, pork, butter -fried foods, fast food, processed food, excessive dairy, eggs and coconut.  3)Get at least 150 minutes of sweaty aerobic exercise per  week.  4)Reduce stress - consider counseling, meditation and relaxation to balance other aspects of your life.    Preventive Care 40-64 Years, Female Preventive care refers to lifestyle choices and visits with your health care provider that can promote health and wellness. What does preventive care include?   A yearly physical exam. This is also called an annual well check.  Dental exams once or twice a year.  Routine eye exams. Ask your health care provider how often you should have your eyes checked.  Personal lifestyle choices, including: ? Daily care of your teeth and gums. ? Regular physical activity. ? Eating a healthy diet. ? Avoiding tobacco and drug use. ? Limiting alcohol use. ? Practicing safe sex. ? Taking vitamin and mineral supplements as recommended by your health care provider. What happens during an annual well check? The services and screenings done by your health care provider during your annual well check will depend on your age, overall health, lifestyle risk factors, and family history of disease. Counseling Your health care provider may ask you questions about your:  Alcohol use.  Tobacco use.  Drug use.  Emotional well-being.  Home and relationship well-being.  Sexual activity.  Eating habits.  Work and work Statistician.  Method of birth control.  Menstrual cycle.  Pregnancy history. Screening You may have the following tests or measurements:  Height, weight, and BMI.  Blood pressure.  Lipid and cholesterol levels. These may be checked every 5 years, or more frequently if you are over  34 years old.  Skin check.  Lung cancer screening. You may have this screening every year starting at age 58 if you have a 30-pack-year history of smoking and currently smoke or have quit within the past 15 years.  Colorectal cancer screening. All adults should have this screening starting at age 57 and continuing until age 35. Your health care  provider may recommend screening at age 11. You will have tests every 1-10 years, depending on your results and the type of screening test. People at increased risk should start screening at an earlier age. Screening tests may include: ? Guaiac-based fecal occult blood testing. ? Fecal immunochemical test (FIT). ? Stool DNA test. ? Virtual colonoscopy. ? Sigmoidoscopy. During this test, a flexible tube with a tiny camera (sigmoidoscope) is used to examine your rectum and lower colon. The sigmoidoscope is inserted through your anus into your rectum and lower colon. ? Colonoscopy. During this test, a long, thin, flexible tube with a tiny camera (colonoscope) is used to examine your entire colon and rectum.  Hepatitis C blood test.  Hepatitis B blood test.  Sexually transmitted disease (STD) testing.  Diabetes screening. This is done by checking your blood sugar (glucose) after you have not eaten for a while (fasting). You may have this done every 1-3 years.  Mammogram. This may be done every 1-2 years. Talk to your health care provider about when you should start having regular mammograms. This may depend on whether you have a family history of breast cancer.  BRCA-related cancer screening. This may be done if you have a family history of breast, ovarian, tubal, or peritoneal cancers.  Pelvic exam and Pap test. This may be done every 3 years starting at age 23. Starting at age 18, this may be done every 5 years if you have a Pap test in combination with an HPV test.  Bone density scan. This is done to screen for osteoporosis. You may have this scan if you are at high risk for osteoporosis. Discuss your test results, treatment options, and if necessary, the need for more tests with your health care provider. Vaccines Your health care provider may recommend certain vaccines, such as:  Influenza vaccine. This is recommended every year.  Tetanus, diphtheria, and acellular pertussis (Tdap, Td)  vaccine. You may need a Td booster every 10 years.  Varicella vaccine. You may need this if you have not been vaccinated.  Zoster vaccine. You may need this after age 52.  Measles, mumps, and rubella (MMR) vaccine. You may need at least one dose of MMR if you were born in 1957 or later. You may also need a second dose.  Pneumococcal 13-valent conjugate (PCV13) vaccine. You may need this if you have certain conditions and were not previously vaccinated.  Pneumococcal polysaccharide (PPSV23) vaccine. You may need one or two doses if you smoke cigarettes or if you have certain conditions.  Meningococcal vaccine. You may need this if you have certain conditions.  Hepatitis A vaccine. You may need this if you have certain conditions or if you travel or work in places where you may be exposed to hepatitis A.  Hepatitis B vaccine. You may need this if you have certain conditions or if you travel or work in places where you may be exposed to hepatitis B.  Haemophilus influenzae type b (Hib) vaccine. You may need this if you have certain conditions. Talk to your health care provider about which screenings and vaccines you need and how often  you need them. This information is not intended to replace advice given to you by your health care provider. Make sure you discuss any questions you have with your health care provider. Document Released: 03/02/2015 Document Revised: 03/26/2017 Document Reviewed: 12/05/2014 Elsevier Interactive Patient Education  2019 [employer].

## 2018-02-26 ENCOUNTER — Telehealth: Payer: Self-pay

## 2018-02-26 NOTE — Telephone Encounter (Signed)
Left a detailed msg concerning the rescheduling of the patient current appointment as requested by patient per 1/9 phone msg return calls. Mailed a calender with letter enclosed

## 2018-03-04 DIAGNOSIS — Z87891 Personal history of nicotine dependence: Secondary | ICD-10-CM | POA: Diagnosis not present

## 2018-03-04 DIAGNOSIS — N6021 Fibroadenosis of right breast: Secondary | ICD-10-CM | POA: Diagnosis not present

## 2018-03-04 DIAGNOSIS — Z79899 Other long term (current) drug therapy: Secondary | ICD-10-CM | POA: Diagnosis not present

## 2018-03-04 DIAGNOSIS — Z8041 Family history of malignant neoplasm of ovary: Secondary | ICD-10-CM | POA: Diagnosis not present

## 2018-03-04 DIAGNOSIS — E669 Obesity, unspecified: Secondary | ICD-10-CM | POA: Diagnosis not present

## 2018-03-04 DIAGNOSIS — N6092 Unspecified benign mammary dysplasia of left breast: Secondary | ICD-10-CM | POA: Diagnosis not present

## 2018-03-04 DIAGNOSIS — N6091 Unspecified benign mammary dysplasia of right breast: Secondary | ICD-10-CM | POA: Diagnosis not present

## 2018-03-04 DIAGNOSIS — Z4001 Encounter for prophylactic removal of breast: Secondary | ICD-10-CM | POA: Diagnosis not present

## 2018-03-04 DIAGNOSIS — Z880 Allergy status to penicillin: Secondary | ICD-10-CM | POA: Diagnosis not present

## 2018-03-04 DIAGNOSIS — Z803 Family history of malignant neoplasm of breast: Secondary | ICD-10-CM | POA: Diagnosis not present

## 2018-03-04 DIAGNOSIS — Z1501 Genetic susceptibility to malignant neoplasm of breast: Secondary | ICD-10-CM | POA: Diagnosis not present

## 2018-03-04 DIAGNOSIS — N6032 Fibrosclerosis of left breast: Secondary | ICD-10-CM | POA: Diagnosis not present

## 2018-03-04 DIAGNOSIS — Z421 Encounter for breast reconstruction following mastectomy: Secondary | ICD-10-CM | POA: Diagnosis not present

## 2018-03-04 DIAGNOSIS — Z801 Family history of malignant neoplasm of trachea, bronchus and lung: Secondary | ICD-10-CM | POA: Diagnosis not present

## 2018-03-04 DIAGNOSIS — Z800 Family history of malignant neoplasm of digestive organs: Secondary | ICD-10-CM | POA: Diagnosis not present

## 2018-03-04 DIAGNOSIS — Z9013 Acquired absence of bilateral breasts and nipples: Secondary | ICD-10-CM | POA: Diagnosis not present

## 2018-03-04 HISTORY — PX: OTHER SURGICAL HISTORY: SHX169

## 2018-03-08 ENCOUNTER — Ambulatory Visit: Payer: BLUE CROSS/BLUE SHIELD | Admitting: Hematology and Oncology

## 2018-03-12 DIAGNOSIS — Z9013 Acquired absence of bilateral breasts and nipples: Secondary | ICD-10-CM | POA: Diagnosis not present

## 2018-03-12 DIAGNOSIS — Z803 Family history of malignant neoplasm of breast: Secondary | ICD-10-CM | POA: Diagnosis not present

## 2018-03-12 DIAGNOSIS — Z1501 Genetic susceptibility to malignant neoplasm of breast: Secondary | ICD-10-CM | POA: Diagnosis not present

## 2018-03-12 DIAGNOSIS — Z09 Encounter for follow-up examination after completed treatment for conditions other than malignant neoplasm: Secondary | ICD-10-CM | POA: Diagnosis not present

## 2018-03-15 ENCOUNTER — Ambulatory Visit: Payer: BLUE CROSS/BLUE SHIELD | Admitting: Hematology and Oncology

## 2018-03-29 ENCOUNTER — Inpatient Hospital Stay: Payer: BLUE CROSS/BLUE SHIELD | Attending: Hematology and Oncology | Admitting: Hematology and Oncology

## 2018-03-29 ENCOUNTER — Telehealth: Payer: Self-pay | Admitting: Hematology and Oncology

## 2018-03-29 DIAGNOSIS — Z79899 Other long term (current) drug therapy: Secondary | ICD-10-CM

## 2018-03-29 DIAGNOSIS — Z1509 Genetic susceptibility to other malignant neoplasm: Secondary | ICD-10-CM | POA: Diagnosis not present

## 2018-03-29 DIAGNOSIS — Z90722 Acquired absence of ovaries, bilateral: Secondary | ICD-10-CM | POA: Diagnosis not present

## 2018-03-29 DIAGNOSIS — Z9013 Acquired absence of bilateral breasts and nipples: Secondary | ICD-10-CM

## 2018-03-29 DIAGNOSIS — Z1501 Genetic susceptibility to malignant neoplasm of breast: Secondary | ICD-10-CM | POA: Diagnosis not present

## 2018-03-29 DIAGNOSIS — Z1502 Genetic susceptibility to malignant neoplasm of ovary: Secondary | ICD-10-CM

## 2018-03-29 DIAGNOSIS — Z8481 Family history of carrier of genetic disease: Secondary | ICD-10-CM | POA: Diagnosis not present

## 2018-03-29 DIAGNOSIS — Z1589 Genetic susceptibility to other disease: Secondary | ICD-10-CM

## 2018-03-29 NOTE — Assessment & Plan Note (Signed)
Breast Cancer Surveillance:  Mammogram 10/16/16: No mammographic evidence of malignancy, Density B MRI Breasts: 10/23/16: No evidence of malignancy  Patient expressed her desire to have bilateral mastectomies.  I will send referral to Dr. Donne Hazel who will coordinate surgeries with the help of plastic surgery. Bilateral salpingo-oophorectomy12/14/2017: Benign  Risk of pancreatic and ovarian cancers: Unfortunately there are no screening studies that are helpful. Clinical monitoring. Her mother was also diagnosed withPALB23mtation. Her daughter was also positive for the mutation.  Return to clinic in 1 year for surveillance follow-up

## 2018-03-29 NOTE — Progress Notes (Signed)
Patient Care Team: Lucretia Kern, DO as PCP - General (Family Medicine)  DIAGNOSIS:  Encounter Diagnosis  Name Primary?  . Monoallelic mutation of PALB2 gene      CHIEF COMPLIANT: Follow-up after having completed bilateral salpingo-oophorectomies as well as bilateral mastectomies with reconstruction  INTERVAL HISTORY: Kristina Pham is a 58 year old with above-mentioned history of PALB 2 gene mutation for which she has undergone bilateral mastectomies at [facility] on 03/04/2018 with benign histology and she also underwent reconstruction at the same time.  She also underwent bilateral salpingo-oophorectomies.  REVIEW OF SYSTEMS:   Constitutional: Denies fevers, chills or abnormal weight loss Eyes: Denies blurriness of vision Ears, nose, mouth, throat, and face: Denies mucositis or sore throat Respiratory: Denies cough, dyspnea or wheezes Cardiovascular: Denies palpitation, chest discomfort Gastrointestinal:  Denies nausea, heartburn or change in bowel habits Skin: Denies abnormal skin rashes Lymphatics: Denies new lymphadenopathy or easy bruising Neurological:Denies numbness, tingling or new weaknesses Behavioral/Psych: Mood is stable, no new changes  Extremities: No lower extremity edema   All other systems were reviewed with the patient and are negative.  I have reviewed the past medical history, past surgical history, social history and family history with the patient and they are unchanged from previous note.  ALLERGIES:  is allergic to penicillins.  MEDICATIONS:  Current Outpatient Medications  Medication Sig Dispense Refill  . Calcium Citrate-Vitamin D 500-500 MG-UNIT PACK Take 1,000 mg by mouth every morning.    . cholecalciferol (VITAMIN D) 1000 units tablet Take 1,000 Units by mouth daily.    . Omega-3 Fatty Acids (FISH OIL PO) Take 1 capsule by mouth daily.     . TURMERIC PO Take by mouth daily.     . vitamin B-12 (CYANOCOBALAMIN) 1000 MCG tablet Take 1,000 mcg by mouth  daily.     No current facility-administered medications for this visit.     PHYSICAL EXAMINATION: ECOG PERFORMANCE STATUS: 1 - Symptomatic but completely ambulatory  Vitals:   03/29/18 1112  BP: 114/71  Pulse: (!) 54  Resp: 18  Temp: 98.4 F (36.9 C)  SpO2: 98%   Filed Weights   03/29/18 1112  Weight: 192 lb 1.6 oz (87.1 kg)    GENERAL:alert, no distress and comfortable SKIN: skin color, texture, turgor are normal, no rashes or significant lesions EYES: normal, Conjunctiva are pink and non-injected, sclera clear OROPHARYNX:no exudate, no erythema and lips, buccal mucosa, and tongue normal  NECK: supple, thyroid normal size, non-tender, without nodularity LYMPH:  no palpable lymphadenopathy in the cervical, axillary or inguinal LUNGS: clear to auscultation and percussion with normal breathing effort HEART: regular rate & rhythm and no murmurs and no lower extremity edema ABDOMEN:abdomen soft, non-tender and normal bowel sounds MUSCULOSKELETAL:no cyanosis of digits and no clubbing  NEURO: alert & oriented x 3 with fluent speech, no focal motor/sensory deficits EXTREMITIES: No lower extremity edema   LABORATORY DATA:  I have reviewed the data as listed CMP Latest Ref Rng & Units 11/24/2017 01/28/2016 01/09/2014  Glucose 70 - 99 mg/dL 93 93 84  BUN 6 - 23 mg/dL '15 12 20  ' Creatinine 0.40 - 1.20 mg/dL 0.78 0.75 0.9  Sodium 135 - 145 mEq/L 141 140 140  Potassium 3.5 - 5.1 mEq/L 4.6 4.0 4.5  Chloride 96 - 112 mEq/L 103 106 105  CO2 19 - 32 mEq/L '30 29 25  ' Calcium 8.4 - 10.5 mg/dL 9.7 9.4 9.4  Total Protein 6.0 - 8.3 g/dL 6.8 6.7 6.7  Total Bilirubin 0.2 -  1.2 mg/dL 0.5 0.8 0.5  Alkaline Phos 39 - 117 U/L 54 52 59  AST 0 - 37 U/L '17 24 21  ' ALT 0 - 35 U/L '17 29 22    ' Lab Results  Component Value Date   WBC 5.2 01/28/2016   HGB 13.8 01/28/2016   HCT 41.5 01/28/2016   MCV 94.7 01/28/2016   PLT 195 01/28/2016   NEUTROABS 2.4 01/09/2014    ASSESSMENT & PLAN:    Monoallelic mutation of PALB2 gene Breast Cancer Surveillance:  Mammogram 10/16/16: No mammographic evidence of malignancy, Density B MRI Breasts: 10/23/16: No evidence of malignancy  Bilateral salpingo-oophorectomy12/14/2017: Benign Bilateral mastectomies with reconstruction at [facility] 03/04/2018  Risk of pancreatic and ovarian cancers: Unfortunately there are no screening studies that are helpful. Clinical monitoring. Her mother was also diagnosed withPALB103mtation. Her daughter was also positive for the mutation. Because there is no family history of pancreatic cancer we do not recommend MRCP for surveillance.  Since there is no further intervention needed from medical oncology we will see her on an as-needed basis.    No orders of the defined types were placed in this encounter.  The patient has a good understanding of the overall plan. she agrees with it. she will call with any problems that may develop before the next visit here.   VHarriette Ohara MD 03/29/18

## 2018-03-29 NOTE — Telephone Encounter (Signed)
No los °

## 2018-05-03 DIAGNOSIS — Z0100 Encounter for examination of eyes and vision without abnormal findings: Secondary | ICD-10-CM | POA: Diagnosis not present

## 2018-07-19 ENCOUNTER — Other Ambulatory Visit: Payer: Self-pay

## 2018-07-19 ENCOUNTER — Ambulatory Visit (INDEPENDENT_AMBULATORY_CARE_PROVIDER_SITE_OTHER): Payer: BLUE CROSS/BLUE SHIELD | Admitting: Family Medicine

## 2018-07-19 ENCOUNTER — Encounter: Payer: Self-pay | Admitting: Family Medicine

## 2018-07-19 DIAGNOSIS — N650 Deformity of reconstructed breast: Secondary | ICD-10-CM | POA: Diagnosis not present

## 2018-07-19 DIAGNOSIS — Z86010 Personal history of colonic polyps: Secondary | ICD-10-CM

## 2018-07-19 DIAGNOSIS — Z803 Family history of malignant neoplasm of breast: Secondary | ICD-10-CM

## 2018-07-19 DIAGNOSIS — Z1509 Genetic susceptibility to other malignant neoplasm: Secondary | ICD-10-CM

## 2018-07-19 DIAGNOSIS — Z1501 Genetic susceptibility to malignant neoplasm of breast: Secondary | ICD-10-CM

## 2018-07-19 DIAGNOSIS — Z131 Encounter for screening for diabetes mellitus: Secondary | ICD-10-CM | POA: Diagnosis not present

## 2018-07-19 DIAGNOSIS — E785 Hyperlipidemia, unspecified: Secondary | ICD-10-CM

## 2018-07-19 DIAGNOSIS — Z9013 Acquired absence of bilateral breasts and nipples: Secondary | ICD-10-CM | POA: Diagnosis not present

## 2018-07-19 DIAGNOSIS — Z1589 Genetic susceptibility to other disease: Secondary | ICD-10-CM

## 2018-07-19 NOTE — Progress Notes (Signed)
Virtual Visit via Video Note  I connected with Kristina Pham   on 07/19/18 at  8:00 AM EDT by a video enabled telemedicine application and verified that I am speaking with the correct person using two identifiers.  Location patient: home Location provider:work office Persons participating in the virtual visit: patient, provider  I discussed the limitations of evaluation and management by telemedicine and the availability of in person appointments. The patient expressed understanding and agreed to proceed.   Kristina Pham DOB: 10/31/1960 Encounter date: 07/19/2018  This is a 58 y.o. female who presents to establish care. Chief Complaint  Patient presents with  . Establish Care    History of present illness: Doing well overall. No specific concerns today. Things she worries about with health are breast cancer and then cholesterol.   Allergies: doing much better this season; have been better in last couple of years.   HL: Has been monitoring with Dr. Kim.   Monoallelic mutation PALB2 gene - follows with heme-onc. Has had bilateral mastectomies and bilateral salpingo-oophorectomy. Follows with gyn for women's health.   Last colon cancer screening 11/2015 w repeat suggested 2027.  Last lipids checked 01/2018 with stability from previous year.    Past Medical History:  Diagnosis Date  . ALLERGIC RHINITIS 11/23/2007   Qualifier: Diagnosis of  By: Wyrick, CMA, [place]    . History of colonic polyps 11/23/2007   Repeat colonoscopy advised in 2018 per GI report    . Microscopic hematuria    had work up with urologist in the past for this  . Monoallelic mutation of PALB2 gene 2017  . Radiculopathy 12/05/2011   Past Surgical History:  Procedure Laterality Date  . BARTHOLIN CYST MARSUPIALIZATION    . Bone marrow extraction    . DILATATION & CURETTAGE/HYSTEROSCOPY WITH MYOSURE N/A 01/31/2016   Procedure: DILATATION & CURETTAGE/HYSTEROSCOPY WITH MYOSURE/RESECTION OF POLYP;  Surgeon:  Timothy P Fontaine, MD;  Location: [facility];  Service: Gynecology;  Laterality: N/A;  . LAPAROSCOPIC BILATERAL SALPINGO OOPHERECTOMY Bilateral 01/31/2016   Procedure: LAPAROSCOPIC BILATERAL SALPINGO OOPHORECTOMY;  Surgeon: Timothy P Fontaine, MD;  Location: [facility];  Service: Gynecology;  Laterality: Bilateral;  TO FOLLOW 7:30 CASE AROUND 9:15AM  Needs one hour OR time.  . mastectomy Bilateral 03/04/2018   preventative  . OTOPLASTY    . TONSILLECTOMY     Allergies  Allergen Reactions  . Penicillins Rash    Has patient had a PCN reaction causing immediate rash, facial/tongue/throat swelling, SOB or lightheadedness with hypotension: Unknown Has patient had a PCN reaction causing severe rash involving mucus membranes or skin necrosis: Unknown Has patient had a PCN reaction that required hospitalization Unknown Has patient had a PCN reaction occurring within the last 10 years: No If all of the above answers are "NO", then may proceed with Cephalosporin use.    No outpatient medications have been marked as taking for the 07/19/18 encounter (Office Visit) with Koberlein, Junell C, MD.   Social History   Tobacco Use  . Smoking status: Former Smoker    Packs/day: 1.00    Years: 6.00    Pack years: 6.00    Types: Cigarettes, E-cigarettes    Last attempt to quit: 12/04/1996    Years since quitting: 21.6  . Smokeless tobacco: Never Used  Substance Use Topics  . Alcohol use: Yes    Alcohol/week: 2.0 standard drinks    Types: 2 Standard drinks or equivalent per week    Comment: OCCASIONALLY   Family History    Problem Relation Age of Onset  . Breast cancer Mother 69  . Heart disease Mother        atrial fib  . Lung cancer Mother        smoker  . Breast cancer Maternal Aunt 52  . Lung cancer Maternal Aunt        smoker  . Breast cancer Maternal Grandmother 62  . Colon cancer Paternal Grandmother        dx in her 60s  . Ovarian cancer Paternal Aunt        dx in her 80s  . Leukemia  Father        dx with CML in his 60s  . Colon polyps Father   . Rheumatic fever Maternal Grandfather   . Colon polyps Brother      Review of Systems  Constitutional: Negative for chills, fatigue and fever.  Respiratory: Negative for cough, chest tightness, shortness of breath and wheezing.   Cardiovascular: Negative for chest pain, palpitations and leg swelling.    Objective:  LMP 08/09/2012       BP Readings from Last 3 Encounters:  03/29/18 114/71  02/15/18 90/70  12/30/17 118/76   Wt Readings from Last 3 Encounters:  03/29/18 192 lb 1.6 oz (87.1 kg)  02/15/18 191 lb 6.4 oz (86.8 kg)  12/30/17 185 lb (83.9 kg)    EXAM:  GENERAL: alert, oriented, appears well and in no acute distress  HEENT: atraumatic, conjunctiva clear, no obvious abnormalities on inspection of external nose and ears  NECK: normal movements of the head and neck  LUNGS: on inspection no signs of respiratory distress, breathing rate appears normal, no obvious gross SOB, gasping or wheezing  CV: no obvious cyanosis  MS: moves all visible extremities without noticeable abnormality  PSYCH/NEURO: pleasant and cooperative, no obvious depression or anxiety, speech and thought processing grossly intact  SKIN: no obvious facial abnormality  Assessment/Plan  1. Hyperlipidemia, unspecified hyperlipidemia type Will plan to recheck bloodwork in December before physical. Continue working on healthy eating and regular exercise. - TSH; Future - Lipid panel; Future - CBC with Differential/Platelet; Future - Comprehensive metabolic panel; Future  2. Screening for diabetes mellitus - Hemoglobin A1c; Future  3. History of colonic polyps UTD with colonoscopy; Next colonoscopy due 2027.  4. Family history of breast cancer Has completed prophylactic mastectomy this year. Following with breast surgeon/gyn.  5. Monoallelic mutation of PALB2 gene See above. Has followed with oncology.     I discussed the  assessment and treatment plan with the patient. The patient was provided an opportunity to ask questions and all were answered. The patient agreed with the plan and demonstrated an understanding of the instructions.   The patient was advised to call back or seek an in-person evaluation if the symptoms worsen or if the condition fails to improve as anticipated.  I provided 20 minutes of non-face-to-face time during this encounter.   Junell Koberlein, MD    

## 2018-07-20 ENCOUNTER — Telehealth: Payer: Self-pay | Admitting: *Deleted

## 2018-07-20 NOTE — Telephone Encounter (Signed)
I left a detailed message at the pts cell number to call back and schedule appts as below.   

## 2018-07-20 NOTE — Telephone Encounter (Signed)
-----   Message from Wynn Banker, MD sent at 07/19/2018  8:25 AM EDT ----- Please schedule for physical  in december; bloodwork prior

## 2018-07-22 DIAGNOSIS — Z9013 Acquired absence of bilateral breasts and nipples: Secondary | ICD-10-CM | POA: Insufficient documentation

## 2018-08-24 DIAGNOSIS — Z1501 Genetic susceptibility to malignant neoplasm of breast: Secondary | ICD-10-CM | POA: Diagnosis not present

## 2018-08-24 DIAGNOSIS — N650 Deformity of reconstructed breast: Secondary | ICD-10-CM | POA: Diagnosis not present

## 2018-08-24 DIAGNOSIS — Z9013 Acquired absence of bilateral breasts and nipples: Secondary | ICD-10-CM | POA: Diagnosis not present

## 2018-08-27 DIAGNOSIS — Z1509 Genetic susceptibility to other malignant neoplasm: Secondary | ICD-10-CM | POA: Diagnosis not present

## 2018-08-27 DIAGNOSIS — Z9882 Breast implant status: Secondary | ICD-10-CM | POA: Diagnosis not present

## 2018-08-27 DIAGNOSIS — Z853 Personal history of malignant neoplasm of breast: Secondary | ICD-10-CM | POA: Diagnosis not present

## 2018-08-27 DIAGNOSIS — Z79899 Other long term (current) drug therapy: Secondary | ICD-10-CM | POA: Diagnosis not present

## 2018-08-27 DIAGNOSIS — Z9013 Acquired absence of bilateral breasts and nipples: Secondary | ICD-10-CM | POA: Diagnosis not present

## 2018-08-27 DIAGNOSIS — Z1501 Genetic susceptibility to malignant neoplasm of breast: Secondary | ICD-10-CM | POA: Diagnosis not present

## 2018-08-27 DIAGNOSIS — Z1589 Genetic susceptibility to other disease: Secondary | ICD-10-CM | POA: Diagnosis not present

## 2018-08-27 DIAGNOSIS — Z87891 Personal history of nicotine dependence: Secondary | ICD-10-CM | POA: Diagnosis not present

## 2018-08-27 DIAGNOSIS — Z803 Family history of malignant neoplasm of breast: Secondary | ICD-10-CM | POA: Diagnosis not present

## 2018-11-16 ENCOUNTER — Encounter: Payer: Self-pay | Admitting: Gynecology

## 2018-12-14 ENCOUNTER — Encounter: Payer: Self-pay | Admitting: Family Medicine

## 2018-12-14 ENCOUNTER — Other Ambulatory Visit: Payer: Self-pay

## 2018-12-14 ENCOUNTER — Ambulatory Visit (INDEPENDENT_AMBULATORY_CARE_PROVIDER_SITE_OTHER): Payer: BLUE CROSS/BLUE SHIELD

## 2018-12-14 DIAGNOSIS — Z23 Encounter for immunization: Secondary | ICD-10-CM

## 2018-12-31 ENCOUNTER — Other Ambulatory Visit: Payer: Self-pay

## 2019-01-03 ENCOUNTER — Ambulatory Visit (INDEPENDENT_AMBULATORY_CARE_PROVIDER_SITE_OTHER): Payer: BLUE CROSS/BLUE SHIELD | Admitting: Gynecology

## 2019-01-03 ENCOUNTER — Other Ambulatory Visit: Payer: Self-pay

## 2019-01-03 ENCOUNTER — Encounter: Payer: Self-pay | Admitting: Gynecology

## 2019-01-03 VITALS — BP 124/80 | Ht 65.5 in | Wt 187.0 lb

## 2019-01-03 DIAGNOSIS — N952 Postmenopausal atrophic vaginitis: Secondary | ICD-10-CM

## 2019-01-03 DIAGNOSIS — Z01419 Encounter for gynecological examination (general) (routine) without abnormal findings: Secondary | ICD-10-CM | POA: Diagnosis not present

## 2019-01-03 NOTE — Patient Instructions (Signed)
Follow-up in 1 year for annual exam 

## 2019-01-03 NOTE — Progress Notes (Signed)
    Kristina Pham September 01, 1960 [id number]        58 y.o.  G2P2002 for annual gynecologic exam.  Without gynecologic complaints.  Past medical history,surgical history, problem list, medications, allergies, family history and social history were all reviewed and documented as reviewed in the EPIC chart.  ROS:  Performed with pertinent positives and negatives included in the history, assessment and plan.   Additional significant findings : None   Exam: Kristina Pham assistant Vitals:   01/03/19 1203  BP: 124/80  Weight: 187 lb (84.8 kg)  Height: 5' 5.5" (1.664 m)   Body mass index is 30.65 kg/m.  General appearance:  Normal affect, orientation and appearance. Skin: Grossly normal HEENT: Without gross lesions.  No cervical or supraclavicular adenopathy. Thyroid normal.  Lungs:  Clear without wheezing, rales or rhonchi Cardiac: RR, without RMG Abdominal:  Soft, nontender, without masses, guarding, rebound, organomegaly or hernia Breasts:  Examined lying and sitting.  Status post bilateral mastectomies with reconstruction.  No masses or axillary adenopathy. Pelvic:  Ext, BUS, Vagina: With atrophic changes  Cervix: With atrophic changes  Uterus: Axial, normal size, shape and contour, midline and mobile nontender   Adnexa: Without masses or tenderness    Anus and perineum: Normal   Rectovaginal: Normal sphincter tone without palpated masses or tenderness.    Assessment/Plan:  58 y.o. G16P2002 female for annual gynecologic exam.   1. History of PABL2 genetic mutation.  Status post prophylactic BSO 2017 and prophylactic bilateral mastectomies 02/2018.  Doing well without complaints. 2. Mammography 2019.  No further mammography's needed due to prophylactic mastectomies. 3. Pap smear 2019.  No Pap smear done today.  No history of significant abnormal Pap smears.  Plan repeat Pap smear at 3-year interval per current screening guidelines. 4. DEXA never.  Will plan at age 84. 21. Colonoscopy  2017.  Repeat at their recommended interval. 6. Health maintenance.  No routine lab work done as patient does this elsewhere.  Follow-up 1 year, sooner as needed.   Kristina Auerbach MD, 12:31 PM 01/03/2019

## 2019-03-02 DIAGNOSIS — Z20828 Contact with and (suspected) exposure to other viral communicable diseases: Secondary | ICD-10-CM | POA: Diagnosis not present

## 2019-03-11 DIAGNOSIS — Z09 Encounter for follow-up examination after completed treatment for conditions other than malignant neoplasm: Secondary | ICD-10-CM | POA: Diagnosis not present

## 2019-03-11 DIAGNOSIS — Z1501 Genetic susceptibility to malignant neoplasm of breast: Secondary | ICD-10-CM | POA: Diagnosis not present

## 2019-03-11 DIAGNOSIS — Z1509 Genetic susceptibility to other malignant neoplasm: Secondary | ICD-10-CM | POA: Diagnosis not present

## 2019-03-11 DIAGNOSIS — Z79899 Other long term (current) drug therapy: Secondary | ICD-10-CM | POA: Diagnosis not present

## 2019-03-11 DIAGNOSIS — Z1589 Genetic susceptibility to other disease: Secondary | ICD-10-CM | POA: Diagnosis not present

## 2019-03-11 DIAGNOSIS — Z87891 Personal history of nicotine dependence: Secondary | ICD-10-CM | POA: Diagnosis not present

## 2019-03-11 DIAGNOSIS — Z9013 Acquired absence of bilateral breasts and nipples: Secondary | ICD-10-CM | POA: Diagnosis not present

## 2019-07-16 DIAGNOSIS — Z20822 Contact with and (suspected) exposure to covid-19: Secondary | ICD-10-CM | POA: Diagnosis not present

## 2019-07-16 DIAGNOSIS — Z20828 Contact with and (suspected) exposure to other viral communicable diseases: Secondary | ICD-10-CM | POA: Diagnosis not present

## 2019-08-24 ENCOUNTER — Ambulatory Visit (INDEPENDENT_AMBULATORY_CARE_PROVIDER_SITE_OTHER): Payer: BLUE CROSS/BLUE SHIELD | Admitting: Family Medicine

## 2019-08-24 ENCOUNTER — Encounter: Payer: Self-pay | Admitting: Family Medicine

## 2019-08-24 ENCOUNTER — Other Ambulatory Visit: Payer: Self-pay

## 2019-08-24 ENCOUNTER — Other Ambulatory Visit (INDEPENDENT_AMBULATORY_CARE_PROVIDER_SITE_OTHER): Payer: BLUE CROSS/BLUE SHIELD

## 2019-08-24 VITALS — BP 90/70 | HR 53 | Temp 97.9°F | Ht 65.25 in | Wt 184.4 lb

## 2019-08-24 DIAGNOSIS — J302 Other seasonal allergic rhinitis: Secondary | ICD-10-CM

## 2019-08-24 DIAGNOSIS — E785 Hyperlipidemia, unspecified: Secondary | ICD-10-CM

## 2019-08-24 DIAGNOSIS — Z0000 Encounter for general adult medical examination without abnormal findings: Secondary | ICD-10-CM | POA: Diagnosis not present

## 2019-08-24 LAB — COMPREHENSIVE METABOLIC PANEL
ALT: 14 U/L (ref 0–35)
AST: 16 U/L (ref 0–37)
Albumin: 4.4 g/dL (ref 3.5–5.2)
Alkaline Phosphatase: 53 U/L (ref 39–117)
BUN: 8 mg/dL (ref 6–23)
CO2: 27 mEq/L (ref 19–32)
Calcium: 9.2 mg/dL (ref 8.4–10.5)
Chloride: 107 mEq/L (ref 96–112)
Creatinine, Ser: 0.79 mg/dL (ref 0.40–1.20)
GFR: 74.39 mL/min (ref >60.00)
Glucose, Bld: 91 mg/dL (ref 70–99)
Potassium: 4.4 mEq/L (ref 3.5–5.1)
Sodium: 141 mEq/L (ref 135–145)
Total Bilirubin: 0.6 mg/dL (ref 0.2–1.2)
Total Protein: 6.3 g/dL (ref 6.0–8.3)

## 2019-08-24 LAB — CBC WITH DIFFERENTIAL/PLATELET
Basophils Absolute: 0.1 10*3/uL (ref 0.0–0.1)
Basophils Relative: 1.2 % (ref 0.0–3.0)
Eosinophils Absolute: 0.1 10*3/uL (ref 0.0–0.7)
Eosinophils Relative: 1.6 % (ref 0.0–5.0)
HCT: 38.6 % (ref 36.0–46.0)
Hemoglobin: 12.9 g/dL (ref 12.0–15.0)
Lymphocytes Relative: 37.6 % (ref 12.0–46.0)
Lymphs Abs: 1.7 10*3/uL (ref 0.7–4.0)
MCHC: 33.5 g/dL (ref 30.0–36.0)
MCV: 98 fl (ref 78.0–100.0)
Monocytes Absolute: 0.4 10*3/uL (ref 0.1–1.0)
Monocytes Relative: 8.8 % (ref 3.0–12.0)
Neutro Abs: 2.3 10*3/uL (ref 1.4–7.7)
Neutrophils Relative %: 50.8 % (ref 43.0–77.0)
Platelets: 189 10*3/uL (ref 150.0–400.0)
RBC: 3.94 Mil/uL (ref 3.87–5.11)
RDW: 12.8 % (ref 11.5–15.5)
WBC: 4.5 10*3/uL (ref 4.0–10.5)

## 2019-08-24 LAB — LIPID PANEL
Cholesterol: 207 mg/dL — ABNORMAL HIGH (ref 0–200)
HDL: 44.2 mg/dL (ref >39.00)
LDL Cholesterol: 146 mg/dL — ABNORMAL HIGH (ref 0–99)
NonHDL: 163.28
Total CHOL/HDL Ratio: 5
Triglycerides: 87 mg/dL (ref 0.0–149.0)
VLDL: 17.4 mg/dL (ref 0.0–40.0)

## 2019-08-24 NOTE — Progress Notes (Signed)
Kristina Pham DOB: [birth year] Encounter date: 08/24/2019  This is a 59 y.o. female who presents for complete physical   History of present illness/Additional concerns:  Last visit was over a year ago. Feels great. No specific concerns today.   Allergies: have been OK this year.   HL: Has been monitoring with Dr. Maudie Mercury. Diet controlled.   Monoallelic mutation PALB2 gene - follows with heme-onc. Has had bilateral mastectomies and bilateral salpingo-oophorectomy. Follows with gyn for women's health.  Last visit with Dr. Phineas Real was 12/2018.  Last colon cancer screening 11/2015 w repeat suggested 2027.  Doesn't follow with dermatology. No skin lesions she worries about.   Past Medical History:  Diagnosis Date  . ALLERGIC RHINITIS 11/23/2007   Qualifier: Diagnosis of  By: Sherlynn Stalls, CMA, [place]    . History of colonic polyps 11/23/2007   Repeat colonoscopy advised in 2018 per GI report    . Microscopic hematuria    had work up with urologist in the past for this  . Monoallelic mutation of PALB2 gene 2017  . Radiculopathy 12/05/2011   Past Surgical History:  Procedure Laterality Date  . BARTHOLIN CYST MARSUPIALIZATION    . Bone marrow extraction    . DILATATION & CURETTAGE/HYSTEROSCOPY WITH MYOSURE N/A 01/31/2016   Procedure: DILATATION & CURETTAGE/HYSTEROSCOPY WITH MYOSURE/RESECTION OF POLYP;  Surgeon: Anastasio Auerbach, MD;  Location: [place] [facility];  Service: Gynecology;  Laterality: N/A;  . LAPAROSCOPIC BILATERAL SALPINGO OOPHERECTOMY Bilateral 01/31/2016   Procedure: LAPAROSCOPIC BILATERAL SALPINGO OOPHORECTOMY;  Surgeon: Anastasio Auerbach, MD;  Location: [place] [facility];  Service: Gynecology;  Laterality: Bilateral;  TO FOLLOW 7:30 CASE AROUND 9:15AM  Needs one hour OR time.  . mastectomy Bilateral 03/04/2018   preventative  . OTOPLASTY    . TONSILLECTOMY     Allergies  Allergen Reactions  . Penicillins Rash    Has patient had a PCN reaction causing immediate rash, facial/tongue/throat  swelling, SOB or lightheadedness with hypotension: Unknown Has patient had a PCN reaction causing severe rash involving mucus membranes or skin necrosis: Unknown Has patient had a PCN reaction that required hospitalization Unknown Has patient had a PCN reaction occurring within the last 10 years: No If all of the above answers are "NO", then may proceed with Cephalosporin use.    Current Meds  Medication Sig  . Calcium Citrate-Vitamin D 500-500 MG-UNIT PACK Take 1,000 mg by mouth every morning.  . cholecalciferol (VITAMIN D) 1000 units tablet Take 1,000 Units by mouth daily.  . Omega-3 Fatty Acids (FISH OIL PO) Take 1 capsule by mouth daily.   . TURMERIC PO Take by mouth daily.   . vitamin B-12 (CYANOCOBALAMIN) 1000 MCG tablet Take 1,000 mcg by mouth daily.   Social History   Tobacco Use  . Smoking status: Former Smoker    Packs/day: 1.00    Years: 6.00    Pack years: 6.00    Types: Cigarettes    Quit date: 12/04/1996    Years since quitting: 22.7  . Smokeless tobacco: Never Used  Substance Use Topics  . Alcohol use: Yes    Alcohol/week: 2.0 standard drinks    Types: 2 Standard drinks or equivalent per week    Comment: OCCASIONALLY   Family History  Problem Relation Age of Onset  . Breast cancer Mother 38  . Heart disease Mother        atrial fib  . Lung cancer Mother        smoker  . Breast cancer Maternal Aunt 52  .  Lung cancer Maternal Aunt        smoker  . Breast cancer Maternal Grandmother 58  . Colon cancer Paternal Grandmother        dx in her 97s  . Ovarian cancer Paternal Aunt        dx in her 42s  . Leukemia Father        dx with CML in his 44s  . Colon polyps Father   . Rheumatic fever Maternal Grandfather   . Colon polyps Brother      Review of Systems  Constitutional: Negative for activity change, appetite change, chills, fatigue, fever and unexpected weight change.  HENT: Negative for congestion, ear pain, hearing loss, sinus pressure, sinus  pain, sore throat and trouble swallowing.   Eyes: Negative for pain and visual disturbance.  Respiratory: Negative for cough, chest tightness, shortness of breath and wheezing.   Cardiovascular: Negative for chest pain, palpitations and leg swelling.  Gastrointestinal: Negative for abdominal pain, blood in stool, constipation, diarrhea, nausea and vomiting.  Genitourinary: Negative for difficulty urinating and menstrual problem.  Musculoskeletal: Negative for arthralgias and back pain.  Skin: Negative for rash.  Neurological: Negative for dizziness, weakness, numbness and headaches.  Hematological: Negative for adenopathy. Does not bruise/bleed easily.  Psychiatric/Behavioral: Negative for sleep disturbance and suicidal ideas. The patient is not nervous/anxious.     CBC:  Lab Results  Component Value Date   WBC 5.2 01/28/2016   HGB 13.8 01/28/2016   HCT 41.5 01/28/2016   MCH 31.5 01/28/2016   MCHC 33.3 01/28/2016   RDW 12.9 01/28/2016   PLT 195 01/28/2016   CMP: Lab Results  Component Value Date   NA 141 11/24/2017   K 4.6 11/24/2017   CL 103 11/24/2017   CO2 30 11/24/2017   ANIONGAP 5 01/28/2016   GLUCOSE 93 11/24/2017   BUN 15 11/24/2017   CREATININE 0.78 11/24/2017   GFRAA >60 01/28/2016   CALCIUM 9.7 11/24/2017   PROT 6.8 11/24/2017   BILITOT 0.5 11/24/2017   ALKPHOS 54 11/24/2017   ALT 17 11/24/2017   AST 17 11/24/2017   LIPID: Lab Results  Component Value Date   CHOL 208 (H) 02/15/2018   TRIG 84.0 02/15/2018   HDL 53.50 02/15/2018   LDLCALC 138 (H) 02/15/2018    Objective:  BP 90/70 (BP Location: Left Arm, Patient Position: Sitting, Cuff Size: Large)   Pulse (!) 53   Temp 97.9 F (36.6 C) (Temporal)   Ht 5' 5.25" (1.657 m)   Wt 184 lb 6.4 oz (83.6 kg)   LMP 08/09/2012   BMI 30.45 kg/m   Weight: 184 lb 6.4 oz (83.6 kg)   BP Readings from Last 3 Encounters:  08/24/19 90/70  01/03/19 124/80  03/29/18 114/71   Wt Readings from Last 3 Encounters:   08/24/19 184 lb 6.4 oz (83.6 kg)  01/03/19 187 lb (84.8 kg)  03/29/18 192 lb 1.6 oz (87.1 kg)    Physical Exam Constitutional:      General: She is not in acute distress.    Appearance: She is well-developed.  HENT:     Head: Normocephalic and atraumatic.     Right Ear: External ear normal.     Left Ear: External ear normal.     Mouth/Throat:     Pharynx: No oropharyngeal exudate.  Eyes:     Conjunctiva/sclera: Conjunctivae normal.     Pupils: Pupils are equal, round, and reactive to light.  Neck:     Thyroid: No  thyromegaly.  Cardiovascular:     Rate and Rhythm: Normal rate and regular rhythm.     Heart sounds: Normal heart sounds. No murmur heard.  No friction rub. No gallop.   Pulmonary:     Effort: Pulmonary effort is normal.     Breath sounds: Normal breath sounds.  Abdominal:     General: Bowel sounds are normal. There is no distension.     Palpations: Abdomen is soft. There is no mass.     Tenderness: There is no abdominal tenderness. There is no guarding.     Hernia: No hernia is present.  Musculoskeletal:        General: No tenderness or deformity. Normal range of motion.     Cervical back: Normal range of motion and neck supple.  Lymphadenopathy:     Cervical: No cervical adenopathy.  Skin:    General: Skin is warm and dry.     Findings: No rash.     Comments: Normal skin exam  Neurological:     Mental Status: She is alert and oriented to person, place, and time.     Deep Tendon Reflexes: Reflexes normal.     Reflex Scores:      Tricep reflexes are 2+ on the right side and 2+ on the left side.      Bicep reflexes are 2+ on the right side and 2+ on the left side.      Brachioradialis reflexes are 2+ on the right side and 2+ on the left side.      Patellar reflexes are 2+ on the right side and 2+ on the left side. Psychiatric:        Speech: Speech normal.        Behavior: Behavior normal.        Thought Content: Thought content normal.      Assessment/Plan: There are no preventive care reminders to display for this patient. Health Maintenance reviewed.  1. Preventative health care Keep up with healthy lifestyle, regular activity.  2. Hyperlipidemia, unspecified hyperlipidemia type bloodwork when able; I will call with results.  - Comprehensive metabolic panel; Future - Lipid panel; Future  3. Seasonal allergies Well controlled. - CBC with Differential/Platelet; Future  Return in about 1 year (around 08/23/2020) for (please schedule bloodwork when able).  Micheline Rough, MD

## 2019-08-25 DIAGNOSIS — Z0100 Encounter for examination of eyes and vision without abnormal findings: Secondary | ICD-10-CM | POA: Diagnosis not present

## 2019-11-15 IMAGING — MG DIGITAL SCREENING BILATERAL MAMMOGRAM WITH TOMO AND CAD
8 series · 9 of 24 positions shown · non-contrast
Comparison: Previous exam(s).

CLINICAL DATA: Screening.

EXAM:
DIGITAL SCREENING BILATERAL MAMMOGRAM WITH TOMO AND CAD

[L CC synth-2D]
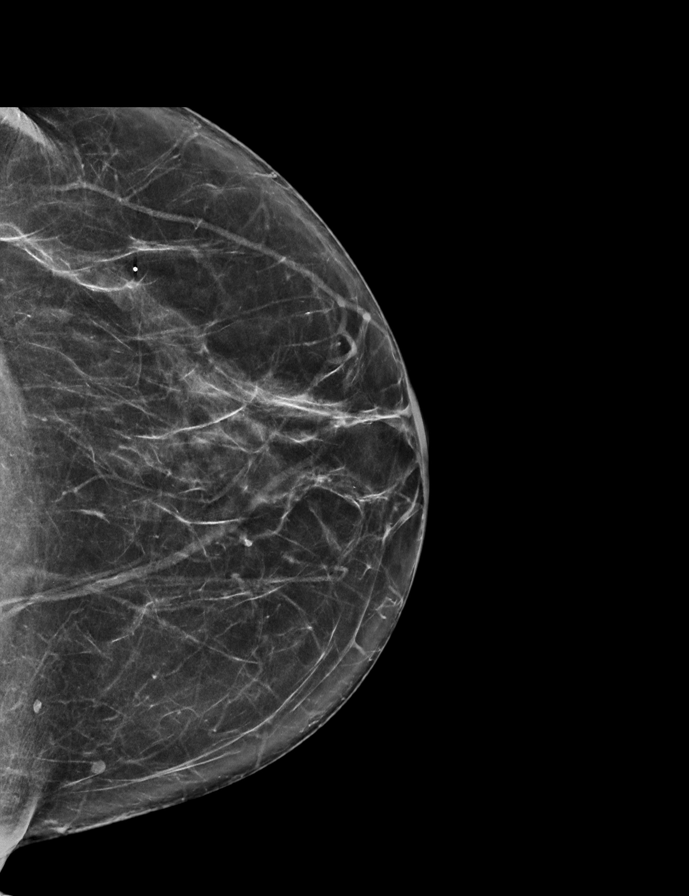

[R CC synth-2D]
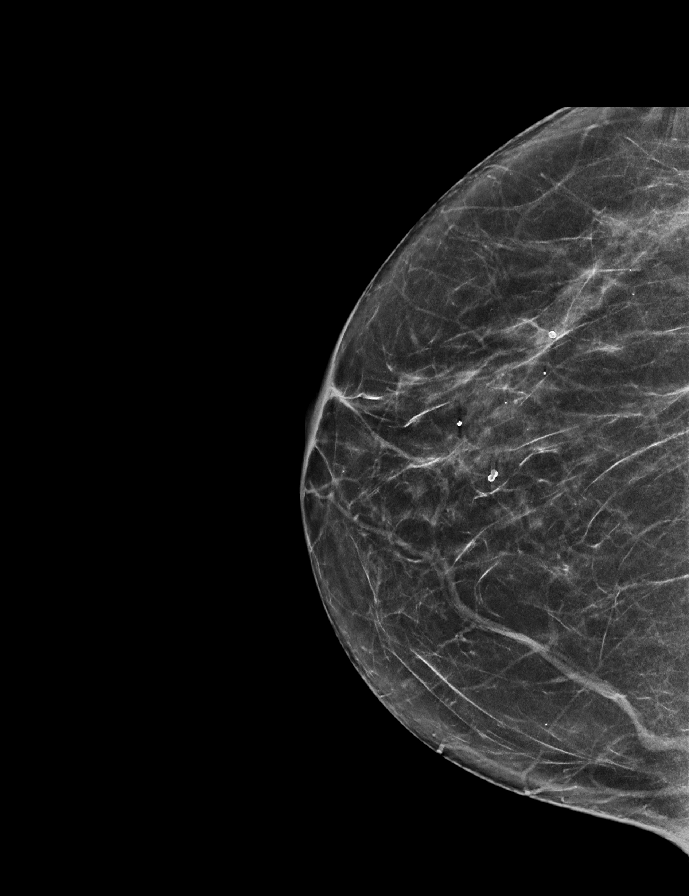

[R MLO synth-2D]
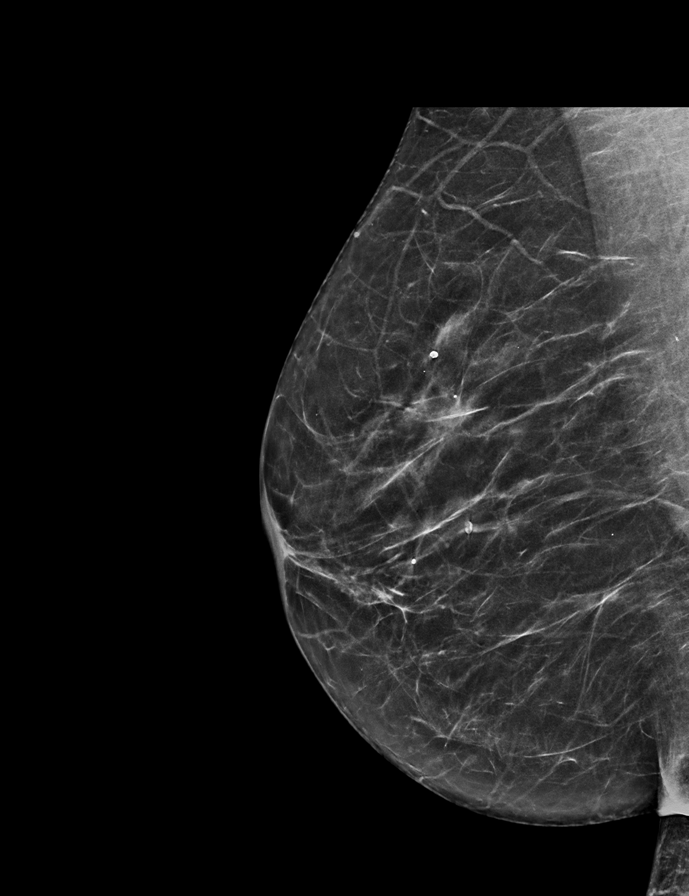

[L MLO synth-2D]
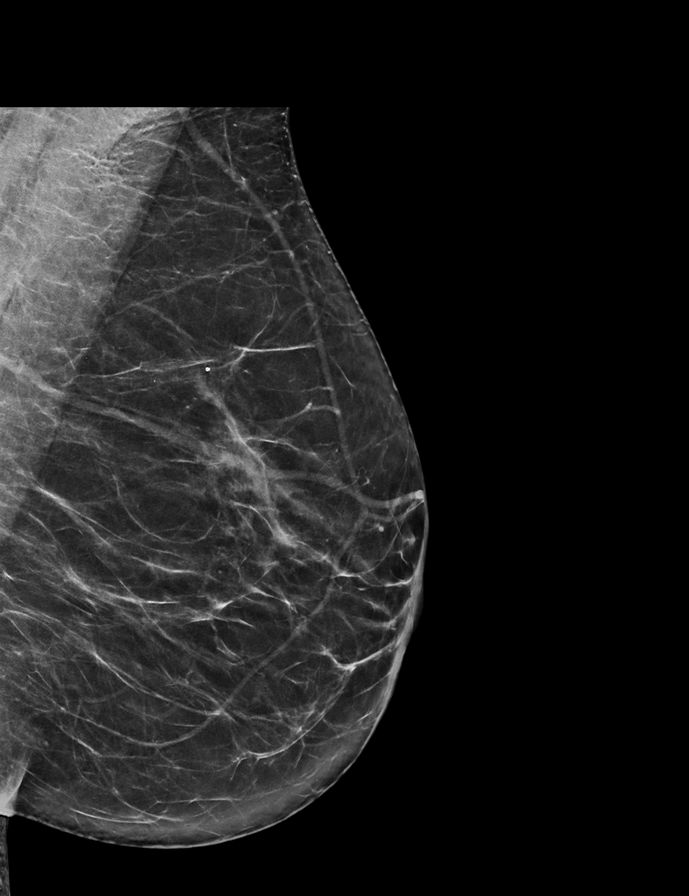

[L CC tomo · 2 of 65 frames shown]
[frame 21/65]
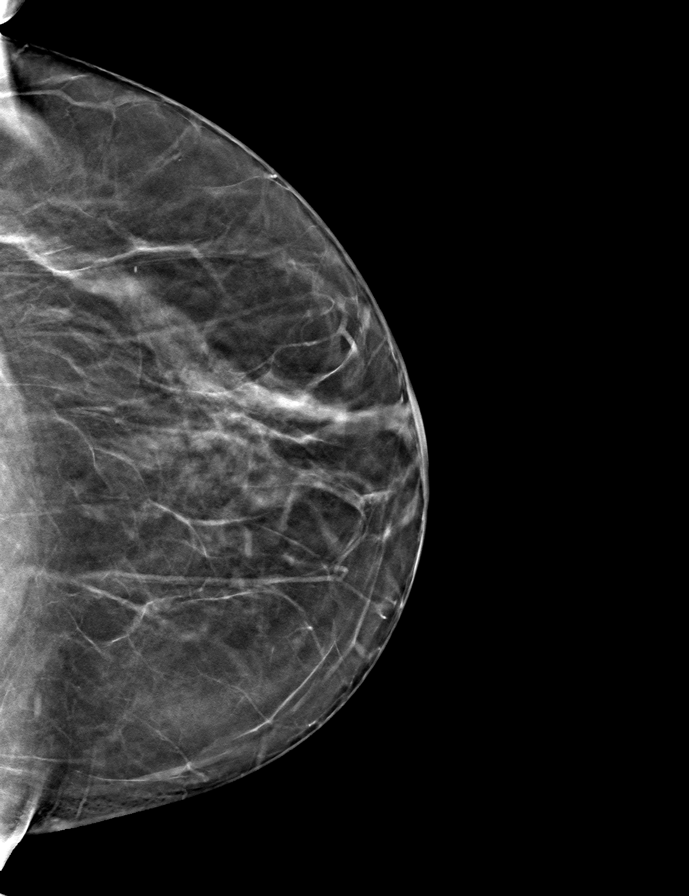
[frame 33/65]
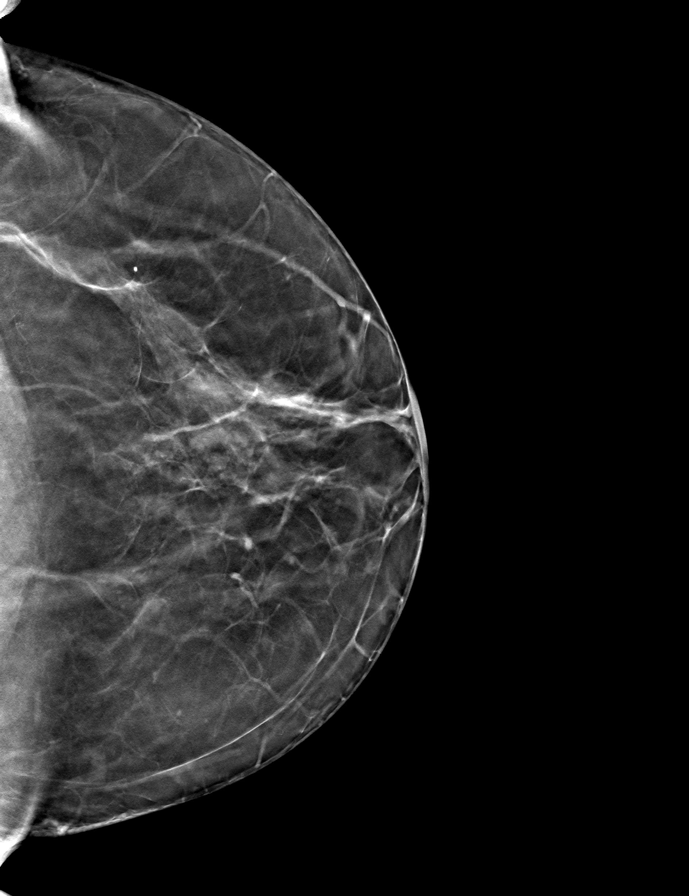

[R CC tomo · tomo slice 33/64.0]
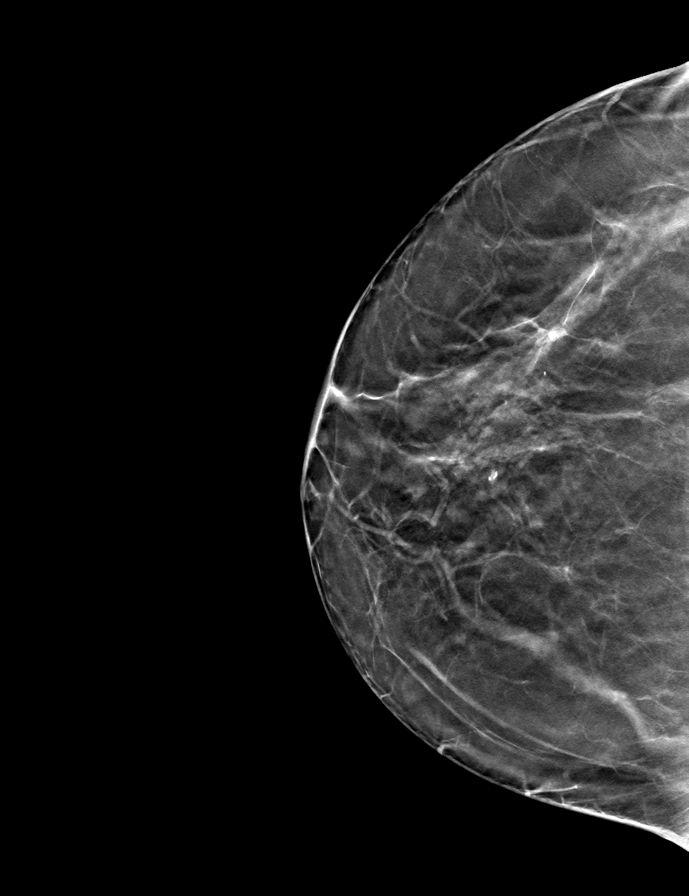

[R MLO tomo · tomo slice 33/64.0]
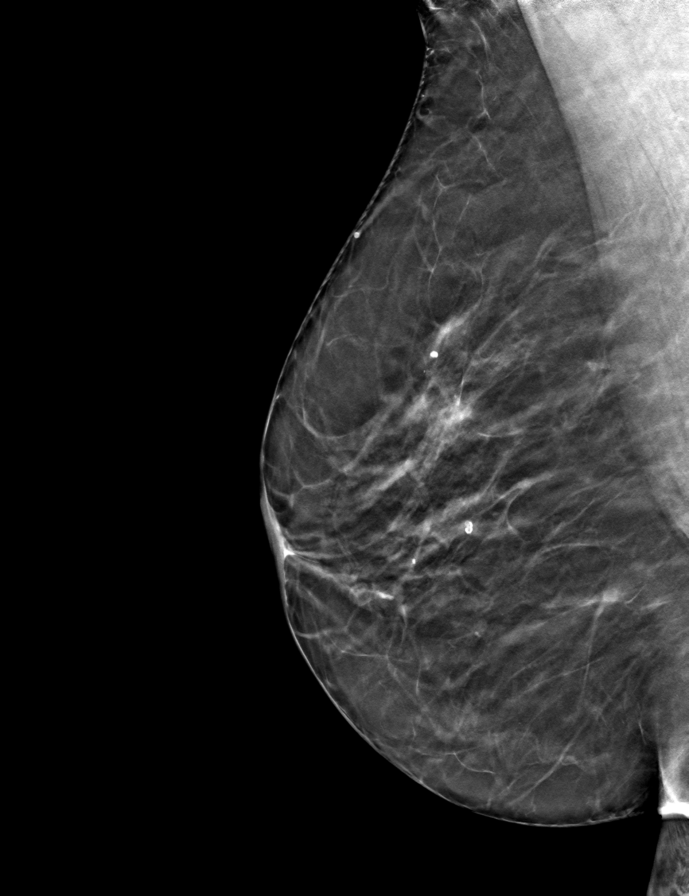

[L MLO tomo · tomo slice 35/68.0]
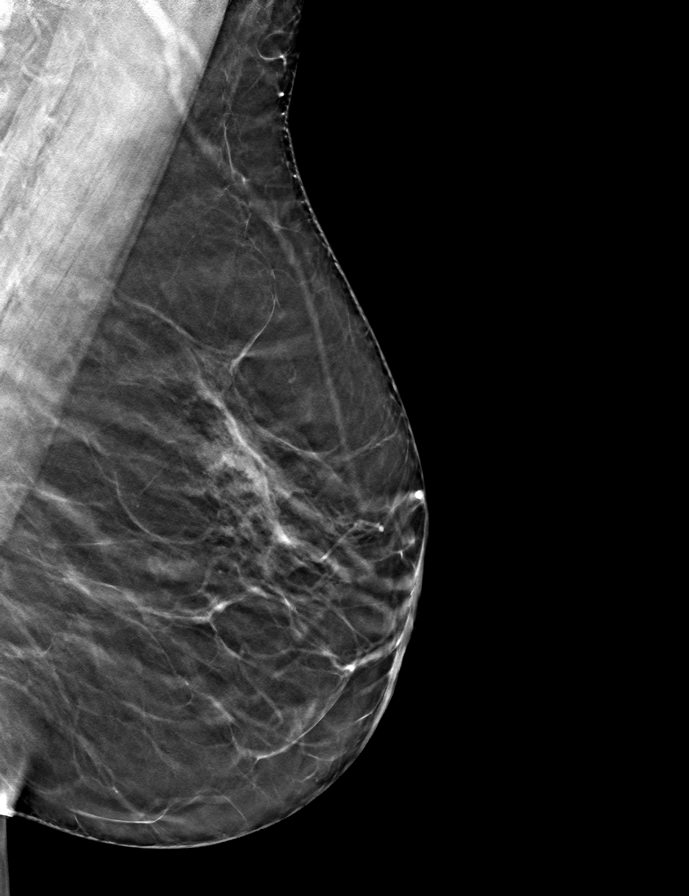

[9 of 24 positions shown; findings below may reference images not displayed]

ACR Breast Density Category b: There are scattered areas of
fibroglandular density.
FINDINGS: There are no findings suspicious for malignancy. Images were
processed with CAD.
IMPRESSION: No mammographic evidence of malignancy. A result letter of this
screening mammogram will be mailed directly to the patient.

RECOMMENDATION:
Screening mammogram in one year. (Code:[id number])

BI-RADS CATEGORY  1: Negative.

## 2020-01-04 ENCOUNTER — Other Ambulatory Visit: Payer: Self-pay

## 2020-01-04 ENCOUNTER — Encounter: Payer: BLUE CROSS/BLUE SHIELD | Admitting: Gynecology

## 2020-01-04 ENCOUNTER — Ambulatory Visit (INDEPENDENT_AMBULATORY_CARE_PROVIDER_SITE_OTHER): Payer: BLUE CROSS/BLUE SHIELD | Admitting: Obstetrics and Gynecology

## 2020-01-04 ENCOUNTER — Encounter: Payer: Self-pay | Admitting: Obstetrics and Gynecology

## 2020-01-04 VITALS — BP 122/76 | Ht 66.0 in | Wt 172.0 lb

## 2020-01-04 DIAGNOSIS — Z01419 Encounter for gynecological examination (general) (routine) without abnormal findings: Secondary | ICD-10-CM

## 2020-01-04 NOTE — Progress Notes (Signed)
   Heavenleigh Petruzzi Jul 22, 1960 [id number]  SUBJECTIVE:  59 y.o. G2P2002 female for annual routine gynecologic exam. She has no gynecologic concerns.  Current Outpatient Medications  Medication Sig Dispense Refill  . cholecalciferol (VITAMIN D) 1000 units tablet Take 1,000 Units by mouth daily.    . Omega-3 Fatty Acids (FISH OIL PO) Take 1 capsule by mouth daily.     . TURMERIC PO Take by mouth daily.     . vitamin B-12 (CYANOCOBALAMIN) 1000 MCG tablet Take 1,000 mcg by mouth daily.    . Calcium Citrate-Vitamin D 500-500 MG-UNIT PACK Take 1,000 mg by mouth every morning. (Patient not taking: Reported on 01/04/2020)     No current facility-administered medications for this visit.   Allergies: Penicillins  Patient's last menstrual period was 08/09/2012.  Past medical history,surgical history, problem list, medications, allergies, family history and social history were all reviewed and documented as reviewed in the EPIC chart.  ROS: Pertinent positives and negatives as reviewed in HPI  OBJECTIVE:  BP 122/76   Ht $R'5\' 6"'bn$  (1.676 m)   Wt 172 lb (78 kg)   LMP 08/09/2012   BMI 27.76 kg/m  The patient appears well, alert, oriented, in no distress. Lungs are clear, good air entry, no wheezes, rhonchi or rales. S1 and S2 normal, no murmurs, regular rate and rhythm.  Abdomen soft without tenderness, guarding, mass or organomegaly.  Neurological is normal, no focal findings.  BREAST EXAM: No suspicious masses or lymphadenopathy in the axilla.  Bilateral mastectomy with reconstruction.  PELVIC EXAM: VULVA: normal appearing vulva with atrophic change, no masses, tenderness or lesions, VAGINA: normal appearing vagina with atrophic change, normal color and discharge, no lesions, CERVIX: normal appearing cervix without discharge or lesions, UTERUS: uterus is normal size, shape, consistency and nontender, ADNEXA: normal adnexa in size, nontender and no masses  Chaperone: [name] present during the  examination  ASSESSMENT:  59 y.o. [id number] here for annual gynecologic exam  PLAN:   1. Postmenopausal, history of PALB2 genetic mutation.  Had prophylactic BSO in 2017, bilateral prophylactic mastectomies 2020.  No significant hot flashes or night sweats.  No vaginal bleeding. 2. Pap smear 2019.  No significant history of abnormal Pap smears.  Next Pap smear due 2022 following the current guidelines recommending the 3 year interval. 3. Mammogram 11/2017.  Normal breast exam today.  Mammography not necessary with the prior prophylactic bilateral mastectomy. 4. Colonoscopy 2017.  Recommend follow-up at the interval recommended by GI specialist. 5. DEXA not yet.  Plan next year at age 58. 9. Health maintenance.  No labs today as she normally has these completed with her primary care doctor.  Return annually or sooner, prn.  Joseph Pierini MD 01/04/20

## 2020-08-24 ENCOUNTER — Encounter: Payer: BLUE CROSS/BLUE SHIELD | Admitting: Family Medicine

## 2020-08-27 ENCOUNTER — Encounter: Payer: Self-pay | Admitting: Family Medicine

## 2020-08-27 ENCOUNTER — Other Ambulatory Visit: Payer: Self-pay

## 2020-08-27 ENCOUNTER — Ambulatory Visit (INDEPENDENT_AMBULATORY_CARE_PROVIDER_SITE_OTHER): Payer: BLUE CROSS/BLUE SHIELD | Admitting: Family Medicine

## 2020-08-27 VITALS — BP 102/70 | HR 51 | Temp 98.2°F | Ht 66.0 in | Wt 182.6 lb

## 2020-08-27 DIAGNOSIS — Z0000 Encounter for general adult medical examination without abnormal findings: Secondary | ICD-10-CM

## 2020-08-27 DIAGNOSIS — Z1501 Genetic susceptibility to malignant neoplasm of breast: Secondary | ICD-10-CM | POA: Diagnosis not present

## 2020-08-27 DIAGNOSIS — Z23 Encounter for immunization: Secondary | ICD-10-CM

## 2020-08-27 DIAGNOSIS — Z1589 Genetic susceptibility to other disease: Secondary | ICD-10-CM

## 2020-08-27 DIAGNOSIS — E785 Hyperlipidemia, unspecified: Secondary | ICD-10-CM

## 2020-08-27 DIAGNOSIS — Z1509 Genetic susceptibility to other malignant neoplasm: Secondary | ICD-10-CM

## 2020-08-27 NOTE — Progress Notes (Signed)
Kristina Pham DOB: [birth year] Encounter date: 08/27/2020  This is a 60 y.o. female who presents for complete physical   History of present illness/Additional concerns: Last visit with me was 1 year ago for complete physical.  Exercising regularly - walking at least 1 hr usually 6 days/week.   Monoallelic mutation PALB2 gene - follows with heme-onc. Has had bilateral mastectomies and bilateral salpingo-oophorectomy. Follows with gyn for women's health.  Last visit with Dr. Delilah Shan was 01/04/20.   Follows with dentist regularly and eye doc yearly.   Last colon cancer screening 11/2015 w repeat suggested 2027. Per gyn no mammography necessary due to prior prophylactic bilat mastectomy.  Past Medical History:  Diagnosis Date   ALLERGIC RHINITIS 11/23/2007   Qualifier: Diagnosis of  By: Sherlynn Stalls, CMA, [place]     History of colonic polyps 11/23/2007   Repeat colonoscopy advised in 2018 per GI report     Microscopic hematuria    had work up with urologist in the past for this   Monoallelic mutation of PALB2 gene 2017   Radiculopathy 12/05/2011   Past Surgical History:  Procedure Laterality Date   BARTHOLIN CYST MARSUPIALIZATION     Bone marrow extraction     DILATATION & CURETTAGE/HYSTEROSCOPY WITH MYOSURE N/A 01/31/2016   Procedure: DILATATION & CURETTAGE/HYSTEROSCOPY WITH MYOSURE/RESECTION OF POLYP;  Surgeon: Anastasio Auerbach, MD;  Location: [facility];  Service: Gynecology;  Laterality: N/A;   LAPAROSCOPIC BILATERAL SALPINGO OOPHERECTOMY Bilateral 01/31/2016   Procedure: LAPAROSCOPIC BILATERAL SALPINGO OOPHORECTOMY;  Surgeon: Anastasio Auerbach, MD;  Location: [facility];  Service: Gynecology;  Laterality: Bilateral;  TO FOLLOW 7:30 CASE AROUND 9:15AM  Needs one hour OR time.   mastectomy Bilateral 03/04/2018   preventative   OTOPLASTY     TONSILLECTOMY     Allergies  Allergen Reactions   Penicillins Rash    Has patient had a PCN reaction causing immediate rash, facial/tongue/throat  swelling, SOB or lightheadedness with hypotension: Unknown Has patient had a PCN reaction causing severe rash involving mucus membranes or skin necrosis: Unknown Has patient had a PCN reaction that required hospitalization Unknown Has patient had a PCN reaction occurring within the last 10 years: No If all of the above answers are "NO", then may proceed with Cephalosporin use.    Current Meds  Medication Sig   cholecalciferol (VITAMIN D) 1000 units tablet Take 1,000 Units by mouth daily.   Omega-3 Fatty Acids (FISH OIL PO) Take 1 capsule by mouth daily.    TURMERIC PO Take by mouth daily.    vitamin B-12 (CYANOCOBALAMIN) 1000 MCG tablet Take 1,000 mcg by mouth daily.   Social History   Tobacco Use   Smoking status: Former    Packs/day: 1.00    Years: 6.00    Pack years: 6.00    Types: Cigarettes    Quit date: 12/04/1996    Years since quitting: 23.7   Smokeless tobacco: Never  Substance Use Topics   Alcohol use: Yes    Alcohol/week: 2.0 standard drinks    Types: 2 Standard drinks or equivalent per week    Comment: OCCASIONALLY   Family History  Problem Relation Age of Onset   Breast cancer Mother 67   Heart disease Mother        atrial fib   Lung cancer Mother        smoker   Macular degeneration Mother    Leukemia Father        dx with CML in his 71s  Colon polyps Father    Colon polyps Brother    Breast cancer Maternal Grandmother 40   Rheumatic fever Maternal Grandfather    Colon cancer Paternal Grandmother        dx in her 46s   Breast cancer Maternal Aunt 52   Lung cancer Maternal Aunt        smoker   Ovarian cancer Paternal Aunt        dx in her 48s     Review of Systems  Constitutional:  Negative for activity change, appetite change, chills, fatigue, fever and unexpected weight change.  HENT:  Negative for congestion, ear pain, hearing loss, sinus pressure, sinus pain, sore throat and trouble swallowing.   Eyes:  Negative for pain and visual  disturbance.  Respiratory:  Negative for cough, chest tightness, shortness of breath and wheezing.   Cardiovascular:  Positive for leg swelling (Right ankle; noted after flight to [place]). Negative for chest pain and palpitations.  Gastrointestinal:  Negative for abdominal pain, blood in stool, constipation, diarrhea, nausea and vomiting.  Genitourinary:  Negative for difficulty urinating and menstrual problem.  Musculoskeletal:  Negative for arthralgias and back pain.  Skin:  Negative for rash.  Neurological:  Negative for dizziness, weakness, numbness and headaches.  Hematological:  Negative for adenopathy. Does not bruise/bleed easily.  Psychiatric/Behavioral:  Negative for sleep disturbance and suicidal ideas. The patient is not nervous/anxious.    CBC:  Lab Results  Component Value Date   WBC 4.5 08/24/2019   HGB 12.9 08/24/2019   HCT 38.6 08/24/2019   MCH 31.5 01/28/2016   MCHC 33.5 08/24/2019   RDW 12.8 08/24/2019   PLT 189.0 08/24/2019   CMP: Lab Results  Component Value Date   NA 141 08/24/2019   K 4.4 08/24/2019   CL 107 08/24/2019   CO2 27 08/24/2019   ANIONGAP 5 01/28/2016   GLUCOSE 91 08/24/2019   BUN 8 08/24/2019   CREATININE 0.79 08/24/2019   GFRAA >60 01/28/2016   CALCIUM 9.2 08/24/2019   PROT 6.3 08/24/2019   BILITOT 0.6 08/24/2019   ALKPHOS 53 08/24/2019   ALT 14 08/24/2019   AST 16 08/24/2019   LIPID: Lab Results  Component Value Date   CHOL 207 (H) 08/24/2019   TRIG 87.0 08/24/2019   HDL 44.20 08/24/2019   LDLCALC 146 (H) 08/24/2019    Objective:  BP 102/70 (BP Location: Left Arm, Patient Position: Sitting, Pham Size: Large)   Pulse (!) 51   Temp 98.2 F (36.8 C) (Oral)   Ht '5\' 6"'  (1.676 m)   Wt 182 lb 9.6 oz (82.8 kg)   LMP 08/09/2012   SpO2 95%   BMI 29.47 kg/m   Weight: 182 lb 9.6 oz (82.8 kg)   BP Readings from Last 3 Encounters:  08/27/20 102/70  01/04/20 122/76  08/24/19 90/70   Wt Readings from Last 3 Encounters:  08/27/20  182 lb 9.6 oz (82.8 kg)  01/04/20 172 lb (78 kg)  08/24/19 184 lb 6.4 oz (83.6 kg)    Physical Exam Constitutional:      General: She is not in acute distress.    Appearance: She is well-developed.  HENT:     Head: Normocephalic and atraumatic.     Right Ear: External ear normal.     Left Ear: External ear normal.     Mouth/Throat:     Pharynx: No oropharyngeal exudate.  Eyes:     Conjunctiva/sclera: Conjunctivae normal.     Pupils: Pupils are  equal, round, and reactive to light.  Neck:     Thyroid: No thyromegaly.  Cardiovascular:     Rate and Rhythm: Normal rate and regular rhythm.     Heart sounds: Normal heart sounds. No murmur heard.   No friction rub. No gallop.  Pulmonary:     Effort: Pulmonary effort is normal.     Breath sounds: Normal breath sounds.  Abdominal:     General: Bowel sounds are normal. There is no distension.     Palpations: Abdomen is soft. There is no mass.     Tenderness: There is no abdominal tenderness. There is no guarding.     Hernia: No hernia is present.  Musculoskeletal:        General: No tenderness or deformity. Normal range of motion.     Cervical back: Normal range of motion and neck supple.     Right lower leg: Right lower leg edema: trace.     Left lower leg: No edema.  Lymphadenopathy:     Cervical: No cervical adenopathy.  Skin:    General: Skin is warm and dry.     Findings: No rash.  Neurological:     Mental Status: She is alert and oriented to person, place, and time.     Deep Tendon Reflexes: Reflexes normal.     Reflex Scores:      Tricep reflexes are 2+ on the right side and 2+ on the left side.      Bicep reflexes are 2+ on the right side and 2+ on the left side.      Brachioradialis reflexes are 2+ on the right side and 2+ on the left side.      Patellar reflexes are 2+ on the right side and 2+ on the left side. Psychiatric:        Speech: Speech normal.        Behavior: Behavior normal.        Thought Content:  Thought content normal.    Assessment/Plan: Health Maintenance Due  Topic Date Due   MAMMOGRAM  12/08/2019   Health Maintenance reviewed. Discussed hep B; will defer since not high risk.   1. Preventative health care Keep up with regular exercise, healthy eating.   2. Hyperlipidemia, unspecified hyperlipidemia type We discussed ascvd scoring and importance of lipid control, esp with aging.  - CBC with Differential/Platelet; Future - Comprehensive metabolic panel; Future - Lipid panel; Future  3. Monoallelic mutation of PALB2 gene Follows with oncology, gyn closely.   4. Need for Hep A vaccination: she does travel frequently; discussed completing series for this benefit. Low risk for Hep B; will not complete this series.  Return in about 6 months (around 02/27/2021) for nurse visit 2nd hep A; physical in 1 year.  Micheline Rough, MD

## 2020-08-27 NOTE — Patient Instructions (Signed)
AREDs eye vitamin - one daily.

## 2020-08-28 LAB — COMPREHENSIVE METABOLIC PANEL
AG Ratio: 2.9 (calc) — ABNORMAL HIGH (ref 1.0–2.5)
ALT: 16 U/L (ref 6–29)
AST: 17 U/L (ref 10–35)
Albumin: 4.6 g/dL (ref 3.6–5.1)
Alkaline phosphatase (APISO): 58 U/L (ref 37–153)
BUN: 15 mg/dL (ref 7–25)
CO2: 28 mmol/L (ref 20–32)
Calcium: 9.2 mg/dL (ref 8.6–10.4)
Chloride: 108 mmol/L (ref 98–110)
Creat: 0.75 mg/dL (ref 0.50–1.05)
Globulin: 1.6 g/dL (calc) — ABNORMAL LOW (ref 1.9–3.7)
Glucose, Bld: 81 mg/dL (ref 65–99)
Potassium: 4.3 mmol/L (ref 3.5–5.3)
Sodium: 142 mmol/L (ref 135–146)
Total Bilirubin: 0.4 mg/dL (ref 0.2–1.2)
Total Protein: 6.2 g/dL (ref 6.1–8.1)

## 2020-08-28 LAB — CBC WITH DIFFERENTIAL/PLATELET
Absolute Monocytes: 348 cells/uL (ref 200–950)
Basophils Absolute: 52 cells/uL (ref 0–200)
Basophils Relative: 1.2 %
Eosinophils Absolute: 69 cells/uL (ref 15–500)
Eosinophils Relative: 1.6 %
HCT: 41.4 % (ref 35.0–45.0)
Hemoglobin: 13.8 g/dL (ref 11.7–15.5)
Lymphs Abs: 1591 cells/uL (ref 850–3900)
MCH: 31.9 pg (ref 27.0–33.0)
MCHC: 33.3 g/dL (ref 32.0–36.0)
MCV: 95.8 fL (ref 80.0–100.0)
MPV: 10.7 fL (ref 7.5–12.5)
Monocytes Relative: 8.1 %
Neutro Abs: 2240 cells/uL (ref 1500–7800)
Neutrophils Relative %: 52.1 %
Platelets: 180 10*3/uL (ref 140–400)
RBC: 4.32 10*6/uL (ref 3.80–5.10)
RDW: 12.2 % (ref 11.0–15.0)
Total Lymphocyte: 37 %
WBC: 4.3 10*3/uL (ref 3.8–10.8)

## 2020-08-28 LAB — LIPID PANEL
Cholesterol: 204 mg/dL — ABNORMAL HIGH (ref <200)
HDL: 53 mg/dL (ref >=50)
LDL Cholesterol (Calc): 133 mg/dL (calc) — ABNORMAL HIGH
Non-HDL Cholesterol (Calc): 151 mg/dL (calc) — ABNORMAL HIGH (ref <130)
Total CHOL/HDL Ratio: 3.8 (calc) (ref <5.0)
Triglycerides: 82 mg/dL (ref <150)

## 2021-01-07 NOTE — Progress Notes (Signed)
60 y.o. G46P2002 Married White or Caucasian Not Hispanic or Latino female here for annual exam.   No vaginal bleeding.  Sexually active, mild entry dyspareunia.   No bowel or bladder c/o.    History of PALB2 genetic mutation.  Had prophylactic BSO in 2017, bilateral prophylactic mastectomies 2020.  Patient's last menstrual period was 08/09/2012.          Sexually active: Yes.    The current method of family planning is post menopausal status.    Exercising: Yes.     Walking  Smoker:  no  Health Maintenance: Pap:  12/30/17 WNL  History of abnormal Pap:  no MMG: 12/07/17 Density B Bi-rads 1 neg  Had mastectomy  BMD:   none  Colonoscopy: 12/18/15 f/u 5 years TDaP:  12/04/2012  Gardasil: n/a   reports that she quit smoking about 24 years ago. Her smoking use included cigarettes. She has a 6.00 pack-year smoking history. She has never used smokeless tobacco. She reports current alcohol use of about 2.0 standard drinks per week. She reports that she does not use drugs. Drinks 2-3 drinks a week. She is a Armed forces technical officer.  2 grown children. Daughter in [place] (just married), in vet school, she has the PALB2 mutation. Son is in medical school at [employer], he hasn't been checked for the mutation.   Past Medical History:  Diagnosis Date   ALLERGIC RHINITIS 11/23/2007   Qualifier: Diagnosis of  By: Sherlynn Stalls, CMA, [place]     History of colonic polyps 11/23/2007   Repeat colonoscopy advised in 2018 per GI report     Microscopic hematuria    had work up with urologist in the past for this   Monoallelic mutation of PALB2 gene 2017   Radiculopathy 12/05/2011    Past Surgical History:  Procedure Laterality Date   BARTHOLIN CYST MARSUPIALIZATION     Bone marrow extraction     DILATATION & CURETTAGE/HYSTEROSCOPY WITH MYOSURE N/A 01/31/2016   Procedure: DILATATION & CURETTAGE/HYSTEROSCOPY WITH MYOSURE/RESECTION OF POLYP;  Surgeon: Anastasio Auerbach, MD;  Location: [facility];  Service:  Gynecology;  Laterality: N/A;   LAPAROSCOPIC BILATERAL SALPINGO OOPHERECTOMY Bilateral 01/31/2016   Procedure: LAPAROSCOPIC BILATERAL SALPINGO OOPHORECTOMY;  Surgeon: Anastasio Auerbach, MD;  Location: [facility];  Service: Gynecology;  Laterality: Bilateral;  TO FOLLOW 7:30 CASE AROUND 9:15AM  Needs one hour OR time.   mastectomy Bilateral 03/04/2018   preventative   OTOPLASTY     TONSILLECTOMY      Current Outpatient Medications  Medication Sig Dispense Refill   cholecalciferol (VITAMIN D) 1000 units tablet Take 1,000 Units by mouth daily.     Omega-3 Fatty Acids (FISH OIL PO) Take 1 capsule by mouth daily.      TURMERIC PO Take by mouth daily.      vitamin B-12 (CYANOCOBALAMIN) 1000 MCG tablet Take 1,000 mcg by mouth daily.     No current facility-administered medications for this visit.    Family History  Problem Relation Age of Onset   Breast cancer Mother 81   Heart disease Mother        atrial fib   Lung cancer Mother        smoker   Macular degeneration Mother    Leukemia Father        dx with CML in his 55s   Colon polyps Father    Colon polyps Brother    Breast cancer Maternal Grandmother 56   Rheumatic fever Maternal Grandfather  Colon cancer Paternal Grandmother        dx in her 61s   Breast cancer Maternal Aunt 52   Lung cancer Maternal Aunt        smoker   Ovarian cancer Paternal Aunt        dx in her 50s    Review of Systems  All other systems reviewed and are negative.  Exam:   BP 122/64   Pulse 65   Ht '5\' 6"'  (1.676 m)   Wt 181 lb (82.1 kg)   LMP 08/09/2012   SpO2 99%   BMI 29.21 kg/m   Weight change: '@WEIGHTCHANGE' @ Height:   Height: '5\' 6"'  (167.6 cm)  Ht Readings from Last 3 Encounters:  01/15/21 '5\' 6"'  (1.676 m)  08/27/20 '5\' 6"'  (1.676 m)  01/04/20 '5\' 6"'  (1.676 m)    General appearance: alert, cooperative and appears stated age Head: Normocephalic, without obvious abnormality, atraumatic Neck: no adenopathy, supple, symmetrical, trachea  midline and thyroid normal to inspection and palpation Lungs: clear to auscultation bilaterally Cardiovascular: regular rate and rhythm Breasts:  evidence of bilateral mastectomies, she has bilateral soft implants. No chest wall masses. No skin changes.  Abdomen: soft, non-tender; non distended,  no masses,  no organomegaly Extremities: extremities normal, atraumatic, no cyanosis or edema Skin: Skin color, texture, turgor normal. No rashes or lesions Lymph nodes: Cervical, supraclavicular, and axillary nodes normal. No abnormal inguinal nodes palpated Neurologic: Grossly normal   Pelvic: External genitalia:  no lesions              Urethra:  normal appearing urethra with no masses, tenderness or lesions              Bartholins and Skenes: normal                 Vagina: normal appearing vagina with normal color and discharge, no lesions. Minimal atrophy, able to insert 2 fingers without pain.              Cervix: no lesions and friable with pap               Bimanual Exam:  Uterus:  normal size, contour, position, consistency, mobility, non-tender and retroverted              Adnexa: no mass, fullness, tenderness               Rectovaginal: Confirms               Anus:  normal sphincter tone, no lesions  Gae Dry chaperoned for the exam.  1. Well woman exam -No mammogram needed -She will check if her colonoscopy is due now or in 5 more years (per colonoscopy report it depended on the pathology report) -Labs with primary -Discussed calcium and vit d intake  2. Screening for cervical cancer - Cytology - PAP  3. Dyspareunia, female -Mild, entry dyspareunia, only hurts initially. -Discussed lubrication, she should control rate and depth of penetration -Discussed the frequency of intercourse -Discussed the option of vaginal dilators -We discussed the potential option of vaginal estrogen. She will let me know if she wants to try this  4. Vaginal atrophy Very mild atrophy, see  above  5. Monoallelic mutation of PALB2 gene S/P bilateral mastectomies and BSO  6. History of bilateral salpingo-oophorectomy (BSO)  7. History of mastectomy, bilateral

## 2021-01-14 ENCOUNTER — Ambulatory Visit: Payer: BLUE CROSS/BLUE SHIELD | Admitting: Obstetrics and Gynecology

## 2021-01-15 ENCOUNTER — Other Ambulatory Visit: Payer: Self-pay

## 2021-01-15 ENCOUNTER — Ambulatory Visit (INDEPENDENT_AMBULATORY_CARE_PROVIDER_SITE_OTHER): Payer: BLUE CROSS/BLUE SHIELD | Admitting: Obstetrics and Gynecology

## 2021-01-15 ENCOUNTER — Other Ambulatory Visit (HOSPITAL_COMMUNITY)
Admission: RE | Admit: 2021-01-15 | Discharge: 2021-01-15 | Disposition: A | Discharge status: Home / Self Care | Payer: BLUE CROSS/BLUE SHIELD | Source: Ambulatory Visit | Attending: Obstetrics and Gynecology | Admitting: Obstetrics and Gynecology

## 2021-01-15 ENCOUNTER — Encounter: Payer: Self-pay | Admitting: Obstetrics and Gynecology

## 2021-01-15 VITALS — BP 122/64 | HR 65 | Ht 66.0 in | Wt 181.0 lb

## 2021-01-15 DIAGNOSIS — Z01419 Encounter for gynecological examination (general) (routine) without abnormal findings: Secondary | ICD-10-CM | POA: Diagnosis not present

## 2021-01-15 DIAGNOSIS — Z1509 Genetic susceptibility to other malignant neoplasm: Secondary | ICD-10-CM

## 2021-01-15 DIAGNOSIS — N941 Unspecified dyspareunia: Secondary | ICD-10-CM

## 2021-01-15 DIAGNOSIS — Z9013 Acquired absence of bilateral breasts and nipples: Secondary | ICD-10-CM

## 2021-01-15 DIAGNOSIS — Z1501 Genetic susceptibility to malignant neoplasm of breast: Secondary | ICD-10-CM

## 2021-01-15 DIAGNOSIS — N952 Postmenopausal atrophic vaginitis: Secondary | ICD-10-CM | POA: Diagnosis not present

## 2021-01-15 DIAGNOSIS — Z9079 Acquired absence of other genital organ(s): Secondary | ICD-10-CM

## 2021-01-15 DIAGNOSIS — Z1589 Genetic susceptibility to other disease: Secondary | ICD-10-CM

## 2021-01-15 DIAGNOSIS — Z90722 Acquired absence of ovaries, bilateral: Secondary | ICD-10-CM

## 2021-01-15 DIAGNOSIS — Z124 Encounter for screening for malignant neoplasm of cervix: Secondary | ICD-10-CM | POA: Diagnosis not present

## 2021-01-15 NOTE — Patient Instructions (Addendum)
Try uberlube for vaginal lubrications. ° °EXERCISE   We recommended that you start or continue a regular exercise program for good health. Physical activity is anything that gets your body moving, some is better than none. The CDC recommends 150 minutes per week of Moderate-Intensity Aerobic Activity and 2 or more days of Muscle Strengthening Activity. ° °Benefits of exercise are limitless: helps weight loss/weight maintenance, improves mood and energy, helps with depression and anxiety, improves sleep, tones and strengthens muscles, improves balance, improves bone density, protects from chronic conditions such as heart disease, high blood pressure and diabetes and so much more. °To learn more visit: https://www.cdc.gov/physicalactivity/index.html ° °DIET: Good nutrition starts with a healthy diet of fruits, vegetables, whole grains, and lean protein sources. Drink plenty of water for hydration. Minimize empty calories, sodium, sweets. For more information about dietary recommendations visit: https://health.gov/our-work/nutrition-physical-activity/dietary-guidelines and https://www.myplate.gov/ ° °ALCOHOL:  Women should limit their alcohol intake to no more than 7 drinks/beers/glasses of wine (combined, not each!) per week. Moderation of alcohol intake to this level decreases your risk of breast cancer and liver damage.  If you are concerned that you may have a problem, or your friends have told you they are concerned about your drinking, there are many resources to help. A well-known program that is free, effective, and available to all people all over the nation is Alcoholics Anonymous.  Check out this site to learn more: https://www.aa.org/ ° ° °CALCIUM AND VITAMIN D:  Adequate intake of calcium and Vitamin D are recommended for bone health.  You should be getting between 1000-1200 mg of calcium and 800 units of Vitamin D daily between diet and supplements ° °PAP SMEARS:  Pap smears, to check for cervical cancer  or precancers,  have traditionally been done yearly, scientific advances have shown that most women can have pap smears less often.  However, every woman still should have a physical exam from her gynecologist every year. It will include a breast check, inspection of the vulva and vagina to check for abnormal growths or skin changes, a visual exam of the cervix, and then an exam to evaluate the size and shape of the uterus and ovaries. We will also provide age appropriate advice regarding health maintenance, like when you should have certain vaccines, screening for sexually transmitted diseases, bone density testing, colonoscopy, mammograms, etc.  ° °MAMMOGRAMS:  All women over 40 years old should have a routine mammogram.  ° °COLON CANCER SCREENING: Now recommend starting at age 45. At this time colonoscopy is not covered for routine screening until 50. There are take home tests that can be done between 45-49.  ° °COLONOSCOPY:  Colonoscopy to screen for colon cancer is recommended for all women at age 50.  We know, you hate the idea of the prep.  We agree, BUT, having colon cancer and not knowing it is worse!!  Colon cancer so often starts as a polyp that can be seen and removed at colonscopy, which can quite literally save your life!  And if your first colonoscopy is normal and you have no family history of colon cancer, most women don't have to have it again for 10 years.  Once every ten years, you can do something that may end up saving your life, right?  We will be happy to help you get it scheduled when you are ready.  Be sure to check your insurance coverage so you understand how much it will cost.  It may be covered as a preventative service at   no cost, but you should check your particular policy.   ° ° ° °Breast Self-Awareness °Breast self-awareness means being familiar with how your breasts look and feel. It involves checking your breasts regularly and reporting any changes to your health care  provider. °Practicing breast self-awareness is important. A change in your breasts can be a sign of a serious medical problem. Being familiar with how your breasts look and feel allows you to find any problems early, when treatment is more likely to be successful. All women should practice breast self-awareness, including women who have had breast implants. °How to do a breast self-exam °One way to learn what is normal for your breasts and whether your breasts are changing is to do a breast self-exam. To do a breast self-exam: °Look for Changes ° °Remove all the clothing above your waist. °Stand in front of a mirror in a room with good lighting. °Put your hands on your hips. °Push your hands firmly downward. °Compare your breasts in the mirror. Look for differences between them (asymmetry), such as: °Differences in shape. °Differences in size. °Puckers, dips, and bumps in one breast and not the other. °Look at each breast for changes in your skin, such as: °Redness. °Scaly areas. °Look for changes in your nipples, such as: °Discharge. °Bleeding. °Dimpling. °Redness. °A change in position. °Feel for Changes °Carefully feel your breasts for lumps and changes. It is best to do this while lying on your back on the floor and again while sitting or standing in the shower or tub with soapy water on your skin. Feel each breast in the following way: °Place the arm on the side of the breast you are examining above your head. °Feel your breast with the other hand. °Start in the nipple area and make ¾ inch (2 cm) overlapping circles to feel your breast. Use the pads of your three middle fingers to do this. Apply light pressure, then medium pressure, then firm pressure. The light pressure will allow you to feel the tissue closest to the skin. The medium pressure will allow you to feel the tissue that is a little deeper. The firm pressure will allow you to feel the tissue close to the ribs. °Continue the overlapping circles,  moving downward over the breast until you feel your ribs below your breast. °Move one finger-width toward the center of the body. Continue to use the ¾ inch (2 cm) overlapping circles to feel your breast as you move slowly up toward your collarbone. °Continue the up and down exam using all three pressures until you reach your armpit. ° °Write Down What You Find ° °Write down what is normal for each breast and any changes that you find. Keep a written record with breast changes or normal findings for each breast. By writing this information down, you do not need to depend only on memory for size, tenderness, or location. Write down where you are in your menstrual cycle, if you are still menstruating. °If you are having trouble noticing differences in your breasts, do not get discouraged. With time you will become more familiar with the variations in your breasts and more comfortable with the exam. °How often should I examine my breasts? °Examine your breasts every month. If you are breastfeeding, the best time to examine your breasts is after a feeding or after using a breast pump. If you menstruate, the best time to examine your breasts is 5-7 days after your period is over. During your period, your breasts are   lumpier, and it may be more difficult to notice changes. °When should I see my health care provider? °See your health care provider if you notice: °A change in shape or size of your breasts or nipples. °A change in the skin of your breast or nipples, such as a reddened or scaly area. °Unusual discharge from your nipples. °A lump or thick area that was not there before. °Pain in your breasts. °Anything that concerns you. ° °

## 2021-01-16 LAB — CYTOLOGY - PAP
Comment: NEGATIVE
Diagnosis: NEGATIVE
High risk HPV: NEGATIVE

## 2021-02-27 ENCOUNTER — Ambulatory Visit (INDEPENDENT_AMBULATORY_CARE_PROVIDER_SITE_OTHER): Payer: BLUE CROSS/BLUE SHIELD

## 2021-02-27 ENCOUNTER — Ambulatory Visit: Payer: BLUE CROSS/BLUE SHIELD

## 2021-02-27 DIAGNOSIS — Z23 Encounter for immunization: Secondary | ICD-10-CM

## 2021-04-17 ENCOUNTER — Ambulatory Visit: Payer: BLUE CROSS/BLUE SHIELD | Admitting: Internal Medicine

## 2021-04-17 ENCOUNTER — Encounter: Payer: Self-pay | Admitting: Internal Medicine

## 2021-04-17 ENCOUNTER — Encounter: Payer: Self-pay | Admitting: Family Medicine

## 2021-04-17 VITALS — BP 120/80 | HR 63 | Temp 97.9°F | Wt 184.9 lb

## 2021-04-17 DIAGNOSIS — S90222A Contusion of left lesser toe(s) with damage to nail, initial encounter: Secondary | ICD-10-CM

## 2021-04-17 NOTE — Progress Notes (Signed)
? ? ? ?Acute office Visit ? ? ? ? ?This visit occurred during the SARS-CoV-2 public health emergency.  Safety protocols were in place, including screening questions prior to the visit, additional usage of staff PPE, and extensive cleaning of exam room while observing appropriate contact time as indicated for disinfecting solutions.  ? ? ?CC/Reason for Visit: Toenail injury ? ?HPI: Kristina Pham is a 61 y.o. female who is coming in today for the above mentioned reasons.  About 6 weeks ago she bumped her left great toe and has subsequently developed a toenail bruise/hematoma.  Nail is still attached. ? ?Past Medical/Surgical History: ?Past Medical History:  ?Diagnosis Date  ? ALLERGIC RHINITIS 11/23/2007  ? Qualifier: Diagnosis of  By: Sherlynn Stalls, [place], [place]    ? History of colonic polyps 11/23/2007  ? Repeat colonoscopy advised in 2018 per GI report    ? Microscopic hematuria   ? had work up with urologist in the past for this  ? Monoallelic mutation of PALB2 gene 2017  ? Radiculopathy 12/05/2011  ? ? ?Past Surgical History:  ?Procedure Laterality Date  ? BARTHOLIN CYST MARSUPIALIZATION    ? Bone marrow extraction    ? DILATATION & CURETTAGE/HYSTEROSCOPY WITH MYOSURE N/A 01/31/2016  ? Procedure: DILATATION & CURETTAGE/HYSTEROSCOPY WITH MYOSURE/RESECTION OF POLYP;  Surgeon: Anastasio Auerbach, MD;  Location: [facility];  Service: Gynecology;  Laterality: N/A;  ? LAPAROSCOPIC BILATERAL SALPINGO OOPHERECTOMY Bilateral 01/31/2016  ? Procedure: LAPAROSCOPIC BILATERAL SALPINGO OOPHORECTOMY;  Surgeon: Anastasio Auerbach, MD;  Location: [facility];  Service: Gynecology;  Laterality: Bilateral;  TO FOLLOW 7:30 CASE AROUND 9:15AM ? ?Needs one hour OR time.  ? mastectomy Bilateral 03/04/2018  ? preventative  ? OTOPLASTY    ? TONSILLECTOMY    ? ? ?Social History: ? reports that she quit smoking about 24 years ago. Her smoking use included cigarettes. She has a 6.00 pack-year smoking history. She has never used smokeless tobacco. She  reports current alcohol use of about 2.0 standard drinks per week. She reports that she does not use drugs. ? ?Allergies: ?Allergies  ?Allergen Reactions  ? Penicillins Rash  ?  Has patient had a PCN reaction causing immediate rash, facial/tongue/throat swelling, SOB or lightheadedness with hypotension: Unknown ?Has patient had a PCN reaction causing severe rash involving mucus membranes or skin necrosis: Unknown ?Has patient had a PCN reaction that required hospitalization Unknown ?Has patient had a PCN reaction occurring within the last 10 years: No ?If all of the above answers are "NO", then may proceed with Cephalosporin use. ?  ? ? ?Family History:  ?Family History  ?Problem Relation Age of Onset  ? Breast cancer Mother 52  ? Heart disease Mother   ?     atrial fib  ? Lung cancer Mother   ?     smoker  ? Macular degeneration Mother   ? Leukemia Father   ?     dx with CML in his 19s  ? Colon polyps Father   ? Colon polyps Brother   ? Breast cancer Maternal Grandmother 14  ? Rheumatic fever Maternal Grandfather   ? Colon cancer Paternal Grandmother   ?     dx in her 19s  ? Breast cancer Maternal Aunt 52  ? Lung cancer Maternal Aunt   ?     smoker  ? Ovarian cancer Paternal Aunt   ?     dx in her 67s  ? ? ? ?Current Outpatient Medications:  ?  cholecalciferol (VITAMIN  D) 1000 units tablet, Take 1,000 Units by mouth daily., Disp: , Rfl:  ?  Omega-3 Fatty Acids (FISH OIL PO), Take 1 capsule by mouth daily. , Disp: , Rfl:  ?  TURMERIC PO, Take by mouth daily. , Disp: , Rfl:  ?  vitamin B-12 (CYANOCOBALAMIN) 1000 MCG tablet, Take 1,000 mcg by mouth daily., Disp: , Rfl:  ? ?Review of Systems:  ?Constitutional: Denies fever, chills, diaphoresis, appetite change and fatigue.  ?HEENT: Denies photophobia, eye pain, redness, hearing loss, ear pain, congestion, sore throat, rhinorrhea, sneezing, mouth sores, trouble swallowing, neck pain, neck stiffness and tinnitus.   ?Respiratory: Denies SOB, DOE, cough, chest tightness,   and wheezing.   ?Cardiovascular: Denies chest pain, palpitations and leg swelling.  ?Gastrointestinal: Denies nausea, vomiting, abdominal pain, diarrhea, constipation, blood in stool and abdominal distention.  ?Genitourinary: Denies dysuria, urgency, frequency, hematuria, flank pain and difficulty urinating.  ?Endocrine: Denies: hot or cold intolerance, sweats, changes in hair or nails, polyuria, polydipsia. ?Musculoskeletal: Denies myalgias, back pain, joint swelling, arthralgias and gait problem.  ?Skin: Denies pallor, rash and wound.  ?Neurological: Denies dizziness, seizures, syncope, weakness, light-headedness, numbness and headaches.  ?Hematological: Denies adenopathy. Easy bruising, personal or family bleeding history  ?Psychiatric/Behavioral: Denies suicidal ideation, mood changes, confusion, nervousness, sleep disturbance and agitation ? ? ? ?Physical Exam: ?Vitals:  ? 04/17/21 1437  ?BP: 120/80  ?Pulse: 63  ?Temp: 97.9 ?F (36.6 ?C)  ?TempSrc: Oral  ?SpO2: 99%  ?Weight: 184 lb 14.4 oz (83.9 kg)  ? ? ?Body mass index is 29.84 kg/m?. ? ? ?Constitutional: NAD, calm, comfortable ?Eyes: PERRL, lids and conjunctivae normal ?ENMT: Mucous membranes are moist.  ?Skin: Left great toenail hematoma/bruise ?Psychiatric: Normal judgment and insight. Alert and oriented x 3. Normal mood.  ? ? ?Impression and Plan: ? ?Contusion of toenail of left foot, initial encounter ?-Nothing further to do today, no evidence of paronychia, suspect she will lose the toenail eventually. ? ?Time spent: 20 minutes reviewing chart, interviewing and examining patient and formulating plan of care. ? ? ? ? ?Lelon Frohlich, MD ?[facility], [place] ? ? ?

## 2021-06-18 DIAGNOSIS — Z0100 Encounter for examination of eyes and vision without abnormal findings: Secondary | ICD-10-CM | POA: Diagnosis not present

## 2021-08-27 ENCOUNTER — Telehealth: Payer: Self-pay

## 2021-08-27 NOTE — Telephone Encounter (Signed)
Last CPE 08/27/20 Pt notified of above. Appt scheduled with Dr Casimiro Needle. Pt will need mammogram ordered at that time.

## 2021-08-28 ENCOUNTER — Encounter: Payer: BLUE CROSS/BLUE SHIELD | Admitting: Family Medicine

## 2021-09-23 ENCOUNTER — Ambulatory Visit (INDEPENDENT_AMBULATORY_CARE_PROVIDER_SITE_OTHER): Payer: BLUE CROSS/BLUE SHIELD | Admitting: Family Medicine

## 2021-09-23 ENCOUNTER — Encounter: Payer: Self-pay | Admitting: Family Medicine

## 2021-09-23 VITALS — BP 112/62 | HR 60 | Temp 98.6°F | Ht 66.0 in | Wt 194.2 lb

## 2021-09-23 DIAGNOSIS — Z1589 Genetic susceptibility to other disease: Secondary | ICD-10-CM

## 2021-09-23 DIAGNOSIS — Z1501 Genetic susceptibility to malignant neoplasm of breast: Secondary | ICD-10-CM | POA: Diagnosis not present

## 2021-09-23 DIAGNOSIS — Z1509 Genetic susceptibility to other malignant neoplasm: Secondary | ICD-10-CM

## 2021-09-23 DIAGNOSIS — E782 Mixed hyperlipidemia: Secondary | ICD-10-CM

## 2021-09-23 NOTE — Assessment & Plan Note (Signed)
Will check CA 19-9 since the patient is considered higher risk of developing pancreatic cancer due to the PALB2 mutation. 

## 2021-09-23 NOTE — Assessment & Plan Note (Signed)
She is due for her yearly labs, ordering Lipid panel, CMP and A1C for surveillance.

## 2021-09-23 NOTE — Progress Notes (Signed)
Established Patient Office Visit  Subjective   Patient ID: Kristina Pham, female    DOB: February 25, 1960  Age: 61 y.o. MRN: [id number]  Chief Complaint  Patient presents with   Establish Care    Patient reports that she is feeling well today. Is here for follow up. States that her cholesterol was being watched regularly with yearly labs, states she is not on any medication for this problem. We reviewed her CVD risk factors and her 10 year overall risk score which is very low.  PALB2 mutation -- pt reports she had a preventative mastectomy and oophorectomy and wanted to ask about other cancers that she could be considered at risk for and what other testing would be indicated.       Review of Systems  All other systems reviewed and are negative.     Objective:     BP 112/62 (BP Location: Left Arm, Patient Position: Sitting, Cuff Size: Large)   Pulse 60   Temp 98.6 F (37 C) (Oral)   Ht '5\' 6"'  (1.676 m)   Wt 194 lb 3.2 oz (88.1 kg)   LMP 07/18/2012   SpO2 98%   BMI 31.34 kg/m    Physical Exam Vitals reviewed.  Constitutional:      Appearance: Normal appearance. She is well-groomed and normal weight.  HENT:     Head: Normocephalic and atraumatic.  Eyes:     Extraocular Movements: Extraocular movements intact.     Pupils: Pupils are equal, round, and reactive to light.  Cardiovascular:     Rate and Rhythm: Normal rate and regular rhythm.     Heart sounds: S1 normal and S2 normal.  Pulmonary:     Effort: Pulmonary effort is normal.     Breath sounds: Normal breath sounds and air entry.  Abdominal:     General: Bowel sounds are normal.     Palpations: Abdomen is soft.  Musculoskeletal:        General: Normal range of motion.     Cervical back: Normal range of motion and neck supple.     Right lower leg: No edema.     Left lower leg: No edema.  Skin:    General: Skin is warm and dry.  Neurological:     Mental Status: She is alert and oriented to person, place,  and time. Mental status is at baseline.     Gait: Gait is intact.  Psychiatric:        Mood and Affect: Mood and affect normal.        Speech: Speech normal.        Behavior: Behavior normal.        Judgment: Judgment normal.      No results found for any visits on 09/23/21.    The 10-year ASCVD risk score (Arnett DK, et al., 2019) is: 2.9%    Assessment & Plan:   Problem List Items Addressed This Visit       Other   Hyperlipemia - Primary    She is due for her yearly labs, ordering Lipid panel, CMP and A1C for surveillance.      Relevant Orders   Lipid panel   CMP   Hemoglobin J0K   Monoallelic mutation of PALB2 gene    Will check CA 19-9 since the patient is considered higher risk of developing pancreatic cancer due to the PALB2 mutation.      Relevant Orders   Cancer Antigen 19-9    Return in about  1 year (around 09/24/2022).    Farrel Conners, MD

## 2021-09-25 ENCOUNTER — Other Ambulatory Visit (INDEPENDENT_AMBULATORY_CARE_PROVIDER_SITE_OTHER): Payer: BLUE CROSS/BLUE SHIELD

## 2021-09-25 DIAGNOSIS — E782 Mixed hyperlipidemia: Secondary | ICD-10-CM

## 2021-09-25 DIAGNOSIS — Z1589 Genetic susceptibility to other disease: Secondary | ICD-10-CM | POA: Diagnosis not present

## 2021-09-25 DIAGNOSIS — Z1501 Genetic susceptibility to malignant neoplasm of breast: Secondary | ICD-10-CM | POA: Diagnosis not present

## 2021-09-25 DIAGNOSIS — Z1509 Genetic susceptibility to other malignant neoplasm: Secondary | ICD-10-CM | POA: Diagnosis not present

## 2021-09-25 LAB — LIPID PANEL
Cholesterol: 201 mg/dL — ABNORMAL HIGH (ref 0–200)
HDL: 52.8 mg/dL (ref >39.00)
LDL Cholesterol: 131 mg/dL — ABNORMAL HIGH (ref 0–99)
NonHDL: 148.57
Total CHOL/HDL Ratio: 4
Triglycerides: 87 mg/dL (ref 0.0–149.0)
VLDL: 17.4 mg/dL (ref 0.0–40.0)

## 2021-09-25 LAB — COMPREHENSIVE METABOLIC PANEL
ALT: 17 U/L (ref 0–35)
AST: 17 U/L (ref 0–37)
Albumin: 4.3 g/dL (ref 3.5–5.2)
Alkaline Phosphatase: 50 U/L (ref 39–117)
BUN: 16 mg/dL (ref 6–23)
CO2: 27 mEq/L (ref 19–32)
Calcium: 9 mg/dL (ref 8.4–10.5)
Chloride: 105 mEq/L (ref 96–112)
Creatinine, Ser: 0.8 mg/dL (ref 0.40–1.20)
GFR: 79.49 mL/min (ref >60.00)
Glucose, Bld: 90 mg/dL (ref 70–99)
Potassium: 4.2 mEq/L (ref 3.5–5.1)
Sodium: 139 mEq/L (ref 135–145)
Total Bilirubin: 0.5 mg/dL (ref 0.2–1.2)
Total Protein: 6.4 g/dL (ref 6.0–8.3)

## 2021-09-25 LAB — HEMOGLOBIN A1C: Hgb A1c MFr Bld: 5.5 % (ref 4.6–6.5)

## 2021-09-26 LAB — CANCER ANTIGEN 19-9: CA 19-9: <3 U/mL (ref <34)

## 2022-01-08 NOTE — Progress Notes (Signed)
61 y.o. G69P2002 Married White or Caucasian Not Hispanic or Latino female here for annual exam.  Sexually active, comfortable with lubrication.   History of PALB2 genetic mutation.  Had prophylactic BSO in 2017, bilateral prophylactic mastectomies 2020.   No bowel or bladder c/o. Mild and stable GSI.   Patient's last menstrual period was 07/18/2012.          Sexually active: Yes.    The current method of family planning is post menopausal status.    Exercising: Yes.     Walking weights  Smoker:  no  Health Maintenance: Pap:01/15/21 WNL Hr HPV Neg, 12/30/17 WNL  History of abnormal Pap:  no MMG:  12/07/17 density B Bi-rads 1 neg. H/O bilateral prophylactic mastectomies in 2020 BMD:   none  Colonoscopy: 12/18/15 f/u 5 years? TDaP:  12/04/2012  Gardasil: n/a   reports that she quit smoking about 25 years ago. Her smoking use included cigarettes. She has a 6.00 pack-year smoking history. She has never used smokeless tobacco. She reports current alcohol use of about 2.0 standard drinks of alcohol per week. She reports that she does not use drugs. She is a Armed forces technical officer. She is considering retirement in the next 1-2 years.  2 grown children. Daughter in [place]. She is married and is in vet/PhD school (graduates in 2025), she has the PALB2 mutation. Son is in 3rd year medical student at [employer] (will do a research year), he hasn't been checked for the mutation.   Past Medical History:  Diagnosis Date   ALLERGIC RHINITIS 11/23/2007   Qualifier: Diagnosis of  By: Sherlynn Stalls, CMA, [place]     History of colonic polyps 11/23/2007   Repeat colonoscopy advised in 2018 per GI report     Microscopic hematuria    had work up with urologist in the past for this   Monoallelic mutation of PALB2 gene 2017   Radiculopathy 12/05/2011    Past Surgical History:  Procedure Laterality Date   BARTHOLIN CYST MARSUPIALIZATION     Bone marrow extraction     DILATATION & CURETTAGE/HYSTEROSCOPY WITH  MYOSURE N/A 01/31/2016   Procedure: DILATATION & CURETTAGE/HYSTEROSCOPY WITH MYOSURE/RESECTION OF POLYP;  Surgeon: Anastasio Auerbach, MD;  Location: [facility];  Service: Gynecology;  Laterality: N/A;   LAPAROSCOPIC BILATERAL SALPINGO OOPHERECTOMY Bilateral 01/31/2016   Procedure: LAPAROSCOPIC BILATERAL SALPINGO OOPHORECTOMY;  Surgeon: Anastasio Auerbach, MD;  Location: [facility];  Service: Gynecology;  Laterality: Bilateral;  TO FOLLOW 7:30 CASE AROUND 9:15AM  Needs one hour OR time.   mastectomy Bilateral 03/04/2018   preventative   OTOPLASTY     TONSILLECTOMY      Current Outpatient Medications  Medication Sig Dispense Refill   cholecalciferol (VITAMIN D) 1000 units tablet Take 1,000 Units by mouth daily.     Omega-3 Fatty Acids (FISH OIL PO) Take 1 capsule by mouth daily.      TURMERIC PO Take by mouth daily.      vitamin B-12 (CYANOCOBALAMIN) 1000 MCG tablet Take 1,000 mcg by mouth daily.     No current facility-administered medications for this visit.    Family History  Problem Relation Age of Onset   Breast cancer Mother 1   Heart disease Mother        atrial fib   Lung cancer Mother        smoker   Macular degeneration Mother    Leukemia Father        dx with CML in his 36s   Colon  polyps Father    Colon polyps Brother    Breast cancer Maternal Aunt 62   Lung cancer Maternal Aunt        smoker   Ovarian cancer Paternal Aunt        dx in her 30s   Breast cancer Maternal Grandmother 62   Rheumatic fever Maternal Grandfather    Colon cancer Paternal Grandmother        dx in her 69s    Review of Systems  All other systems reviewed and are negative.   Exam:   BP 124/72   Pulse 85   Ht 5' 5.35" (1.66 m)   Wt 186 lb (84.4 kg)   LMP 07/18/2012   SpO2 98%   BMI 30.62 kg/m   Weight change: _0 @ Height:   Height: 5' 5.35" (166 cm)  Ht Readings from Last 3 Encounters:  01/16/22 5' 5.35" (1.66 m)  09/23/21 _1  (1.676 m)  01/15/21 _2  (1.676 m)     General appearance: alert, cooperative and appears stated age Head: Normocephalic, without obvious abnormality, atraumatic Neck: no adenopathy, supple, symmetrical, trachea midline and thyroid normal to inspection and palpation Lungs: clear to auscultation bilaterally Cardiovascular: regular rate and rhythm Breasts:  s/p bilateral mastectomies with reconstruction, bilateral implants are soft, no chest wall masses or skin changes.  Abdomen: soft, non-tender; non distended,  no masses,  no organomegaly Extremities: extremities normal, atraumatic, no cyanosis or edema Skin: Skin color, texture, turgor normal. No rashes or lesions Lymph nodes: Cervical, supraclavicular, and axillary nodes normal. No abnormal inguinal nodes palpated Neurologic: Grossly normal   Pelvic: External genitalia:  no lesions              Urethra:  normal appearing urethra with no masses, tenderness or lesions              Bartholins and Skenes: normal                 Vagina: atrophic appearing vagina with normal color and discharge, no lesions              Cervix: no lesions               Bimanual Exam:  Uterus:  normal size, contour, position, consistency, mobility, non-tender              Adnexa: no mass, fullness, tenderness               Rectovaginal: Confirms               Anus:  normal sphincter tone, no lesions  Kimalexis, RMA chaperoned for the exam.  1. Well woman exam No pap this year No mammogram needed She will check when her colonoscopy is due Labs with primary Discussed calcium and vit D intake   2. Monoallelic mutation of PALB2 gene S/P BSO and bilateral mastectomies  3. History of bilateral salpingo-oophorectomy (BSO)  4. History of mastectomy, bilateral

## 2022-01-16 ENCOUNTER — Ambulatory Visit (INDEPENDENT_AMBULATORY_CARE_PROVIDER_SITE_OTHER): Payer: BLUE CROSS/BLUE SHIELD | Admitting: Obstetrics and Gynecology

## 2022-01-16 ENCOUNTER — Encounter: Payer: Self-pay | Admitting: Obstetrics and Gynecology

## 2022-01-16 VITALS — BP 124/72 | HR 85 | Ht 65.35 in | Wt 186.0 lb

## 2022-01-16 DIAGNOSIS — Z90722 Acquired absence of ovaries, bilateral: Secondary | ICD-10-CM

## 2022-01-16 DIAGNOSIS — Z1589 Genetic susceptibility to other disease: Secondary | ICD-10-CM

## 2022-01-16 DIAGNOSIS — Z01419 Encounter for gynecological examination (general) (routine) without abnormal findings: Secondary | ICD-10-CM

## 2022-01-16 DIAGNOSIS — Z9013 Acquired absence of bilateral breasts and nipples: Secondary | ICD-10-CM

## 2022-01-16 NOTE — Patient Instructions (Signed)

## 2022-09-25 ENCOUNTER — Ambulatory Visit (INDEPENDENT_AMBULATORY_CARE_PROVIDER_SITE_OTHER): Payer: PRIVATE HEALTH INSURANCE | Admitting: Family Medicine

## 2022-09-25 ENCOUNTER — Encounter: Payer: Self-pay | Admitting: Family Medicine

## 2022-09-25 VITALS — BP 98/65 | HR 59 | Temp 98.6°F | Ht 66.0 in | Wt 187.7 lb

## 2022-09-25 DIAGNOSIS — Z0000 Encounter for general adult medical examination without abnormal findings: Secondary | ICD-10-CM | POA: Diagnosis not present

## 2022-09-25 DIAGNOSIS — Z13220 Encounter for screening for lipoid disorders: Secondary | ICD-10-CM | POA: Diagnosis not present

## 2022-09-25 DIAGNOSIS — G4725 Circadian rhythm sleep disorder, jet lag type: Secondary | ICD-10-CM | POA: Diagnosis not present

## 2022-09-25 LAB — COMPREHENSIVE METABOLIC PANEL
ALT: 18 U/L (ref 0–35)
AST: 18 U/L (ref 0–37)
Albumin: 4.4 g/dL (ref 3.5–5.2)
Alkaline Phosphatase: 50 U/L (ref 39–117)
BUN: 13 mg/dL (ref 6–23)
CO2: 29 mEq/L (ref 19–32)
Calcium: 9.6 mg/dL (ref 8.4–10.5)
Chloride: 107 mEq/L (ref 96–112)
Creatinine, Ser: 0.78 mg/dL (ref 0.40–1.20)
GFR: 81.36 mL/min (ref >60.00)
Glucose, Bld: 90 mg/dL (ref 70–99)
Potassium: 5 mEq/L (ref 3.5–5.1)
Sodium: 142 mEq/L (ref 135–145)
Total Bilirubin: 0.5 mg/dL (ref 0.2–1.2)
Total Protein: 6.5 g/dL (ref 6.0–8.3)

## 2022-09-25 LAB — CBC WITH DIFFERENTIAL/PLATELET
Basophils Absolute: 0 10*3/uL (ref 0.0–0.1)
Basophils Relative: 0.9 % (ref 0.0–3.0)
Eosinophils Absolute: 0.1 10*3/uL (ref 0.0–0.7)
Eosinophils Relative: 1.6 % (ref 0.0–5.0)
HCT: 43.4 % (ref 36.0–46.0)
Hemoglobin: 14 g/dL (ref 12.0–15.0)
Lymphocytes Relative: 37.3 % (ref 12.0–46.0)
Lymphs Abs: 1.7 10*3/uL (ref 0.7–4.0)
MCHC: 32.2 g/dL (ref 30.0–36.0)
MCV: 97.7 fl (ref 78.0–100.0)
Monocytes Absolute: 0.3 10*3/uL (ref 0.1–1.0)
Monocytes Relative: 6.7 % (ref 3.0–12.0)
Neutro Abs: 2.5 10*3/uL (ref 1.4–7.7)
Neutrophils Relative %: 53.5 % (ref 43.0–77.0)
Platelets: 189 10*3/uL (ref 150.0–400.0)
RBC: 4.44 Mil/uL (ref 3.87–5.11)
RDW: 13.3 % (ref 11.5–15.5)
WBC: 4.7 10*3/uL (ref 4.0–10.5)

## 2022-09-25 LAB — LIPID PANEL
Cholesterol: 213 mg/dL — ABNORMAL HIGH (ref 0–200)
HDL: 52.9 mg/dL (ref >39.00)
LDL Cholesterol: 147 mg/dL — ABNORMAL HIGH (ref 0–99)
NonHDL: 160.55
Total CHOL/HDL Ratio: 4
Triglycerides: 69 mg/dL (ref 0.0–149.0)
VLDL: 13.8 mg/dL (ref 0.0–40.0)

## 2022-09-25 MED ORDER — HYDROXYZINE PAMOATE 50 MG PO CAPS
50.0000 mg | ORAL_CAPSULE | Freq: Every day | ORAL | 0 refills | Status: DC
Start: 2022-09-25 — End: 2022-10-03

## 2022-09-25 NOTE — Patient Instructions (Signed)

## 2022-09-25 NOTE — Progress Notes (Signed)
Complete physical exam  Patient: Kristina Pham   DOB: [birth year]   62 y.o. Female  MRN: [id number]  Subjective:    Chief Complaint  Patient presents with   Annual Exam    Patient wants to know if you can give her something for Anxiety  she has a 24 hour flight next week     Kristina Pham is a 62 y.o. female who presents today for a complete physical exam. She reports consuming a  pescetarian (ovo lacto)  diet. Home exercise routine includes walking 5 hrs per week. She generally feels well. She reports sleeping well. She does not have additional problems to discuss today.   Today's PHQ is 1 and GAD is 0 Most recent fall risk assessment:    04/17/2021    2:45 PM  Fall Risk   Falls in the past year? 0  Number falls in past yr: 0  Injury with Fall? 0  Follow up Falls evaluation completed     Most recent depression screenings:    09/23/2021    9:26 AM 04/17/2021    2:45 PM  PHQ 2/9 Scores  PHQ - 2 Score 0 0  PHQ- 9 Score 0 0    Vision:Within last year, wear contacts, no vision issues.   Sees a regular dentist, sees them every 6 months, no dental problems currently.   Patient Active Problem List   Diagnosis Date Noted   Acquired absence of breast and absent nipple, bilateral 07/22/2018   Monoallelic mutation of PALB2 gene 02/26/2015   Genetic testing 02/01/2015   Family history of breast cancer    Family history of colon cancer    Family history of ovarian cancer    Hyperlipemia 11/23/2007   History of colonic polyps 11/23/2007      Patient Care Team: Karie Georges, MD as PCP - General (Family Medicine) Fontaine, Nadyne Coombes, MD (Inactive) as Consulting Physician (Gynecology) Downs-Canner, Elenore Paddy, MD as Referring Physician (General Surgery) Romualdo Bolk, MD (Inactive) as Consulting Physician (Obstetrics and Gynecology)   Outpatient Medications Prior to Visit  Medication Sig   cholecalciferol (VITAMIN D) 1000 units tablet Take 1,000 Units by mouth  daily.   Omega-3 Fatty Acids (FISH OIL PO) Take 1 capsule by mouth daily.    TURMERIC PO Take by mouth daily.    vitamin B-12 (CYANOCOBALAMIN) 1000 MCG tablet Take 1,000 mcg by mouth daily.   No facility-administered medications prior to visit.    Review of Systems  HENT:  Negative for hearing loss.   Eyes:  Negative for blurred vision.  Respiratory:  Negative for shortness of breath.   Cardiovascular:  Negative for chest pain.  Gastrointestinal: Negative.   Genitourinary: Negative.   Musculoskeletal:  Negative for back pain.  Neurological:  Negative for headaches.  Psychiatric/Behavioral:  Negative for depression.   All other systems reviewed and are negative.      Objective:     BP 98/65 (BP Location: Right Arm, Patient Position: Sitting, Cuff Size: Normal)   Pulse (!) 59   Temp 98.6 F (37 C) (Oral)   Ht 5\' 6"  (1.676 m)   Wt 187 lb 11.2 oz (85.1 kg)   LMP 07/18/2012   SpO2 98%   BMI 30.30 kg/m    Physical Exam Vitals reviewed.  Constitutional:      Appearance: Normal appearance. She is well-groomed and normal weight.  HENT:     Right Ear: Tympanic membrane and ear canal normal.     Left  Ear: Tympanic membrane and ear canal normal.     Mouth/Throat:     Mouth: Mucous membranes are moist.     Pharynx: No posterior oropharyngeal erythema.  Eyes:     Conjunctiva/sclera: Conjunctivae normal.  Neck:     Thyroid: No thyromegaly.  Cardiovascular:     Rate and Rhythm: Normal rate and regular rhythm.     Pulses: Normal pulses.     Heart sounds: S1 normal and S2 normal.  Pulmonary:     Effort: Pulmonary effort is normal.     Breath sounds: Normal breath sounds and air entry.  Abdominal:     General: Abdomen is flat. Bowel sounds are normal.     Palpations: Abdomen is soft.  Musculoskeletal:     Right lower leg: No edema.     Left lower leg: No edema.  Lymphadenopathy:     Cervical: No cervical adenopathy.  Neurological:     Mental Status: She is alert and  oriented to person, place, and time. Mental status is at baseline.     Gait: Gait is intact.  Psychiatric:        Mood and Affect: Mood and affect normal.        Speech: Speech normal.        Behavior: Behavior normal.        Judgment: Judgment normal.      No results found for any visits on 09/25/22.     Assessment & Plan:    Routine Health Maintenance and Physical Exam  Immunization History  Administered Date(s) Administered   Hepatitis A, Adult 08/27/2020, 02/27/2021   Influenza Split 12/05/2011, 01/10/2014   Influenza Whole 11/23/2007   Influenza,inj,Quad PF,6+ Mos 01/06/2013, 12/25/2014, 12/20/2015, 12/23/2016, 10/22/2017, 12/14/2018   Influenza-Unspecified 01/27/2014, 11/21/2019, 11/17/2020, 11/20/2021   PFIZER(Purple Top)SARS-COV-2 Vaccination 04/14/2019, 05/06/2019, 12/13/2019, 07/13/2020, 11/17/2020, 11/20/2021   Tdap 12/05/2011, 02/13/2022   Zoster Recombinant(Shingrix) 11/25/2017, 04/19/2018    Health Maintenance  Topic Date Due   COVID-19 Vaccine (7 - 2023-24 season) 01/15/2022   INFLUENZA VACCINE  09/18/2022   HIV Screening  08/27/2024 (Originally 03/31/1975)   PAP SMEAR-Modifier  01/16/2024   Colonoscopy  12/18/2025   DTaP/Tdap/Td (3 - Td or Tdap) 02/14/2032   Zoster Vaccines- Shingrix  Completed   Hepatitis C Screening  Addressed   HPV VACCINES  Aged Out   MAMMOGRAM  Discontinued    Discussed health benefits of physical activity, and encouraged her to engage in regular exercise appropriate for her age and condition.  Lipid screening -     Lipid panel  Routine general medical examination at a health care facility -     Comprehensive metabolic panel -     CBC with Differential/Platelet  Jet lag -     hydrOXYzine Pamoate; Take 1 capsule (50 mg total) by mouth at bedtime.  Dispense: 15 capsule; Refill: 0  Normal physical exam findings today-- she is UTD on her health maintenance, ordering labs for annual surveillance. Handouts given on healthy eating  and exercise. Counseled patient on sleep hygiene.   Return in 1 year (on 09/25/2023).     Karie Georges, MD

## 2022-10-03 ENCOUNTER — Other Ambulatory Visit: Payer: Self-pay | Admitting: Family Medicine

## 2022-10-03 DIAGNOSIS — G4725 Circadian rhythm sleep disorder, jet lag type: Secondary | ICD-10-CM

## 2022-12-26 ENCOUNTER — Other Ambulatory Visit: Payer: Self-pay | Admitting: Medical Genetics

## 2022-12-26 DIAGNOSIS — Z006 Encounter for examination for normal comparison and control in clinical research program: Secondary | ICD-10-CM

## 2023-01-27 ENCOUNTER — Other Ambulatory Visit (HOSPITAL_COMMUNITY)
Admission: RE | Admit: 2023-01-27 | Discharge: 2023-01-27 | Disposition: A | Discharge status: Home / Self Care | Payer: Self-pay | Source: Ambulatory Visit | Attending: Oncology | Admitting: Oncology

## 2023-01-27 DIAGNOSIS — Z006 Encounter for examination for normal comparison and control in clinical research program: Secondary | ICD-10-CM

## 2023-02-07 LAB — GENECONNECT MOLECULAR SCREEN: Genetic Analysis Overall Interpretation: NEGATIVE

## 2023-05-25 ENCOUNTER — Ambulatory Visit (INDEPENDENT_AMBULATORY_CARE_PROVIDER_SITE_OTHER): Payer: PRIVATE HEALTH INSURANCE | Admitting: Podiatry

## 2023-05-25 ENCOUNTER — Encounter: Payer: Self-pay | Admitting: Podiatry

## 2023-05-25 ENCOUNTER — Ambulatory Visit (INDEPENDENT_AMBULATORY_CARE_PROVIDER_SITE_OTHER): Payer: PRIVATE HEALTH INSURANCE

## 2023-05-25 DIAGNOSIS — M21612 Bunion of left foot: Secondary | ICD-10-CM

## 2023-05-25 DIAGNOSIS — M21619 Bunion of unspecified foot: Secondary | ICD-10-CM

## 2023-05-25 DIAGNOSIS — M7752 Other enthesopathy of left foot: Secondary | ICD-10-CM

## 2023-05-25 MED ORDER — METHYLPREDNISOLONE 4 MG PO TBPK: ORAL_TABLET | ORAL | 0 refills | Status: DC

## 2023-05-26 DIAGNOSIS — M7752 Other enthesopathy of left foot: Secondary | ICD-10-CM

## 2023-05-26 MED ORDER — BETAMETHASONE SOD PHOS & ACET 6 (3-3) MG/ML IJ SUSP
3.0000 mg | Freq: Once | INTRAMUSCULAR | Status: AC
Start: 2023-05-26 — End: 2023-05-26
  Administered 2023-05-26: 3 mg via INTRA_ARTICULAR

## 2023-05-26 NOTE — Progress Notes (Signed)
 Chief Complaint  Patient presents with   Bunions    np Painful bunion that is causing foot, leg, and hip pain    HPI: 63 y.o. female presenting today as a new patient for evaluation of a symptomatic bunion deformity and pain to the left forefoot.  Ongoing for several months.  No history of injury.  She experiences pain and tenderness on a daily basis.  Past Medical History:  Diagnosis Date   ALLERGIC RHINITIS 11/23/2007   Qualifier: Diagnosis of  By: Linna Darner, CMA, Cindy     History of colonic polyps 11/23/2007   Repeat colonoscopy advised in 2018 per GI report     Microscopic hematuria    had work up with urologist in the past for this   Monoallelic mutation of PALB2 gene 2017   Radiculopathy 12/05/2011    Past Surgical History:  Procedure Laterality Date   BARTHOLIN CYST MARSUPIALIZATION     Bone marrow extraction     DILATATION & CURETTAGE/HYSTEROSCOPY WITH MYOSURE N/A 01/31/2016   Procedure: DILATATION & CURETTAGE/HYSTEROSCOPY WITH MYOSURE/RESECTION OF POLYP;  Surgeon: Dara Lords, MD;  Location: [facility];  Service: Gynecology;  Laterality: N/A;   LAPAROSCOPIC BILATERAL SALPINGO OOPHERECTOMY Bilateral 01/31/2016   Procedure: LAPAROSCOPIC BILATERAL SALPINGO OOPHORECTOMY;  Surgeon: Dara Lords, MD;  Location: [facility];  Service: Gynecology;  Laterality: Bilateral;  TO FOLLOW 7:30 CASE AROUND 9:15AM  Needs one hour OR time.   mastectomy Bilateral 03/04/2018   preventative   OTOPLASTY     TONSILLECTOMY      Allergies  Allergen Reactions   Penicillins Rash    Has patient had a PCN reaction causing immediate rash, facial/tongue/throat swelling, SOB or lightheadedness with hypotension: Unknown Has patient had a PCN reaction causing severe rash involving mucus membranes or skin necrosis: Unknown Has patient had a PCN reaction that required hospitalization Unknown Has patient had a PCN reaction occurring within the last 10 years: No If all of the above answers are  "NO", then may proceed with Cephalosporin use.      Physical Exam: General: The patient is alert and oriented x3 in no acute distress.  Dermatology: Skin is warm, dry and supple bilateral lower extremities.   Vascular: Palpable pedal pulses bilaterally. Capillary refill within normal limits.  No appreciable edema.  No erythema.  Neurological: Grossly intact via light touch  Musculoskeletal Exam: Hallux valgus deformity noted left foot.  No tenderness with palpation or range of motion.  There is tenderness with palpation and range of motion to the second MTP of the foot.  Radiographic Exam LT foot 05/25/2023:  Normal osseous mineralization. Joint spaces preserved.  No fractures or osseous irregularities noted.  Hallux valgus deformity noted with increased intermetatarsal angle  Assessment/Plan of Care: 1.  Second MTP capsulitis left 2.  Hallux valgus left  -Patient evaluated.  X-rays reviewed -Currently the bunion deformity is minimally symptomatic.  The majority of her pain and tenderness is associated to the second MTP the foot -Refrain from going barefoot.  Recommend good supportive shoes and sneakers with arch supports to offload pressure from the forefoot -Injection of 0.5 cc Celestone Soluspan injected into the second MTP left foot -Prescription for Medrol Dosepak, then resume OTC Aleve as needed -Return to clinic as needed       Felecia Shelling, DPM [facility]  Dr. Felecia Shelling, DPM    [address]  [place], [place] [address]                Office [phone]  Fax [phone]

## 2023-09-28 ENCOUNTER — Encounter: Payer: Self-pay | Admitting: Family Medicine

## 2023-09-28 ENCOUNTER — Ambulatory Visit: Payer: PRIVATE HEALTH INSURANCE | Admitting: Family Medicine

## 2023-09-28 VITALS — BP 112/70 | HR 63 | Temp 98.3°F | Ht 65.5 in | Wt 191.1 lb

## 2023-09-28 DIAGNOSIS — E782 Mixed hyperlipidemia: Secondary | ICD-10-CM

## 2023-09-28 DIAGNOSIS — Z0000 Encounter for general adult medical examination without abnormal findings: Secondary | ICD-10-CM | POA: Diagnosis not present

## 2023-09-28 LAB — CBC WITH DIFFERENTIAL/PLATELET
Basophils Absolute: 0.1 K/uL (ref 0.0–0.1)
Basophils Relative: 1.2 % (ref 0.0–3.0)
Eosinophils Absolute: 0.1 K/uL (ref 0.0–0.7)
Eosinophils Relative: 2.1 % (ref 0.0–5.0)
HCT: 43 % (ref 36.0–46.0)
Hemoglobin: 14.3 g/dL (ref 12.0–15.0)
Lymphocytes Relative: 32.4 % (ref 12.0–46.0)
Lymphs Abs: 1.3 K/uL (ref 0.7–4.0)
MCHC: 33.3 g/dL (ref 30.0–36.0)
MCV: 96.2 fl (ref 78.0–100.0)
Monocytes Absolute: 0.3 K/uL (ref 0.1–1.0)
Monocytes Relative: 7.4 % (ref 3.0–12.0)
Neutro Abs: 2.3 K/uL (ref 1.4–7.7)
Neutrophils Relative %: 56.9 % (ref 43.0–77.0)
Platelets: 194 K/uL (ref 150.0–400.0)
RBC: 4.46 Mil/uL (ref 3.87–5.11)
RDW: 12.6 % (ref 11.5–15.5)
WBC: 4.1 K/uL (ref 4.0–10.5)

## 2023-09-28 LAB — COMPREHENSIVE METABOLIC PANEL WITH GFR
ALT: 15 U/L (ref 0–35)
AST: 15 U/L (ref 0–37)
Albumin: 4.2 g/dL (ref 3.5–5.2)
Alkaline Phosphatase: 60 U/L (ref 39–117)
BUN: 10 mg/dL (ref 6–23)
CO2: 29 meq/L (ref 19–32)
Calcium: 9 mg/dL (ref 8.4–10.5)
Chloride: 105 meq/L (ref 96–112)
Creatinine, Ser: 0.84 mg/dL (ref 0.40–1.20)
GFR: 73.92 mL/min (ref >60.00)
Glucose, Bld: 84 mg/dL (ref 70–99)
Potassium: 4.2 meq/L (ref 3.5–5.1)
Sodium: 140 meq/L (ref 135–145)
Total Bilirubin: 0.6 mg/dL (ref 0.2–1.2)
Total Protein: 6.3 g/dL (ref 6.0–8.3)

## 2023-09-28 LAB — LIPID PANEL
Cholesterol: 202 mg/dL — ABNORMAL HIGH (ref 0–200)
HDL: 50.6 mg/dL (ref >39.00)
LDL Cholesterol: 141 mg/dL — ABNORMAL HIGH (ref 0–99)
NonHDL: 151.55
Total CHOL/HDL Ratio: 4
Triglycerides: 55 mg/dL (ref 0.0–149.0)
VLDL: 11 mg/dL (ref 0.0–40.0)

## 2023-09-28 NOTE — Patient Instructions (Addendum)
 [email or web address]  [email or web address]  [email or web address]  Health Maintenance, Female Adopting a healthy lifestyle and getting preventive care are important in promoting health and wellness. Ask your health care provider about: The right schedule for you to have regular tests and exams. Things you can do on your own to prevent diseases and keep yourself healthy. What should I know about diet, weight, and exercise? Eat a healthy diet  Eat a diet that includes plenty of vegetables, fruits, low-fat dairy products, and lean protein. Do not eat a lot of foods that are high in solid fats, added sugars, or sodium. Maintain a healthy weight Body mass index (BMI) is used to identify weight problems. It estimates body fat based on height and weight. Your health care provider can help determine your BMI and help you achieve or maintain a healthy weight. Get regular exercise Get regular exercise. This is one of the most important things you can do for your health. Most adults should: Exercise for at least 150 minutes each week. The exercise should increase your heart rate and make you sweat (moderate-intensity exercise). Do strengthening exercises at least twice a week. This is in addition to the moderate-intensity exercise. Spend less time sitting. Even light physical activity can be beneficial. Watch cholesterol and blood lipids Have your blood tested for lipids and cholesterol at 63 years of age, then have this test every 5 years. Have your cholesterol levels checked more often if: Your lipid or cholesterol levels are high. You are older than 63 years of age. You are at high risk for heart disease. What should I know about cancer screening? Depending on your health history and family history, you may need to have cancer screening at various ages. This may include screening for: Breast cancer. Cervical cancer. Colorectal cancer. Skin cancer. Lung cancer. What should I know about heart  disease, diabetes, and high blood pressure? Blood pressure and heart disease High blood pressure causes heart disease and increases the risk of stroke. This is more likely to develop in people who have high blood pressure readings or are overweight. Have your blood pressure checked: Every 3-5 years if you are 63-63 years of age. Every year if you are 11 years old or older. Diabetes Have regular diabetes screenings. This checks your fasting blood sugar level. Have the screening done: Once every three years after age 63 if you are at a normal weight and have a low risk for diabetes. More often and at a younger age if you are overweight or have a high risk for diabetes. What should I know about preventing infection? Hepatitis B If you have a higher risk for hepatitis B, you should be screened for this virus. Talk with your health care provider to find out if you are at risk for hepatitis B infection. Hepatitis C Testing is recommended for: Everyone born from [birth year] through 1965. Anyone with known risk factors for hepatitis C. Sexually transmitted infections (STIs) Get screened for STIs, including gonorrhea and chlamydia, if: You are sexually active and are younger than 63 years of age. You are older than 63 years of age and your health care provider tells you that you are at risk for this type of infection. Your sexual activity has changed since you were last screened, and you are at increased risk for chlamydia or gonorrhea. Ask your health care provider if you are at risk. Ask your health care provider about whether you are at high risk for HIV. Your health care provider may recommend a  prescription medicine to help prevent HIV infection. If you choose to take medicine to prevent HIV, you should first get tested for HIV. You should then be tested every 3 months for as long as you are taking the medicine. Pregnancy If you are about to stop having your period (premenopausal) and you may become  pregnant, seek counseling before you get pregnant. Take 400 to 800 micrograms (mcg) of folic acid every day if you become pregnant. Ask for birth control (contraception) if you want to prevent pregnancy. Osteoporosis and menopause Osteoporosis is a disease in which the bones lose minerals and strength with aging. This can result in bone fractures. If you are 63 years old or older, or if you are at risk for osteoporosis and fractures, ask your health care provider if you should: Be screened for bone loss. Take a calcium  or vitamin D  supplement to lower your risk of fractures. Be given hormone replacement therapy (HRT) to treat symptoms of menopause. Follow these instructions at home: Alcohol use Do not drink alcohol if: Your health care provider tells you not to drink. You are pregnant, may be pregnant, or are planning to become pregnant. If you drink alcohol: Limit how much you have to: 0-1 drink a day. Know how much alcohol is in your drink. In the [place] one drink equals one 12 oz bottle of beer (355 mL), one 5 oz glass of wine (148 mL), or one 1 oz glass of hard liquor (44 mL). Lifestyle Do not use any products that contain nicotine or tobacco. These products include cigarettes, chewing tobacco, and vaping devices, such as e-cigarettes. If you need help quitting, ask your health care provider. Do not use street drugs. Do not share needles. Ask your health care provider for help if you need support or information about quitting drugs. General instructions Schedule regular health, dental, and eye exams. Stay current with your vaccines. Tell your health care provider if: You often feel depressed. You have ever been abused or do not feel safe at home. Summary Adopting a healthy lifestyle and getting preventive care are important in promoting health and wellness. Follow your health care provider's instructions about healthy diet, exercising, and getting tested or screened for  diseases. Follow your health care provider's instructions on monitoring your cholesterol and blood pressure. This information is not intended to replace advice given to you by your health care provider. Make sure you discuss any questions you have with your health care provider. Document Revised: 06/25/2020 Document Reviewed: 06/25/2020 Elsevier Patient Education  2024 [employer].

## 2023-09-28 NOTE — Progress Notes (Signed)
 Complete physical exam  Patient: Kristina Pham   DOB: [birth year]   63 y.o. Female  MRN: [id number]  Subjective:    Chief Complaint  Patient presents with   Annual Exam    Kristina Pham is a 63 y.o. female who presents today for a complete physical exam. She reports consuming a pescetarian diet eats eggs and fish, eats some dairy, yogurts, etc. Home exercise routine includes walking 2-3  hrs per week. She generally feels well. She reports sleeping somewhat poorly, is trying to get more sleep, goes to bed earlier, occasional early morning awakenings. She does not have additional problems to discuss today.    Most recent fall risk assessment:    04/17/2021    2:45 PM  Fall Risk   Falls in the past year? 0  Number falls in past yr: 0  Injury with Fall? 0  Follow up Falls evaluation completed      Data saved with a previous flowsheet row definition     Most recent depression screenings:    09/28/2023    9:14 AM 09/25/2022   10:18 AM  PHQ 2/9 Scores  PHQ - 2 Score 0 0  PHQ- 9 Score 1 1    Vision:Within last year and Dental: No current dental problems and Receives regular dental care  Patient Active Problem List   Diagnosis Date Noted   Acquired absence of breast and absent nipple, bilateral 07/22/2018   Monoallelic mutation of PALB2 gene 02/26/2015   Genetic testing 02/01/2015   Family history of breast cancer    Family history of colon cancer    Family history of ovarian cancer    Hyperlipemia 11/23/2007   History of colonic polyps 11/23/2007      Patient Care Team: Ozell Heron HERO, MD as PCP - General (Family Medicine) Fontaine, Evalene SQUIBB, MD (Inactive) as Consulting Physician (Gynecology) Downs-Canner, Corean HERO, MD as Referring Physician (General Surgery) Jannis Kate Norris, MD as Consulting Physician (Obstetrics and Gynecology)   Outpatient Medications Prior to Visit  Medication Sig   cholecalciferol (VITAMIN D ) 1000 units tablet Take 1,000 Units by  mouth daily. (Patient taking differently: Take 1,000 Units by mouth 2 (two) times a week.)   Omega-3 Fatty Acids (FISH OIL PO) Take 1 capsule by mouth daily.    TURMERIC PO Take by mouth daily.    vitamin B-12 (CYANOCOBALAMIN) 1000 MCG tablet Take 1,000 mcg by mouth daily.   [DISCONTINUED] hydrOXYzine  (VISTARIL ) 50 MG capsule TAKE 1 CAPSULE BY MOUTH AT BEDTIME.   [DISCONTINUED] methylPREDNISolone  (MEDROL  DOSEPAK) 4 MG TBPK tablet 6 day dose pack - take as directed   No facility-administered medications prior to visit.    Review of Systems  HENT:  Negative for hearing loss.   Eyes:  Negative for blurred vision.  Respiratory:  Negative for shortness of breath.   Cardiovascular:  Negative for chest pain.  Gastrointestinal: Negative.   Genitourinary: Negative.   Musculoskeletal:  Negative for back pain.  Neurological:  Negative for headaches.  Psychiatric/Behavioral:  Negative for depression.   All other systems reviewed and are negative.      Objective:     BP 112/70   Pulse 63   Temp 98.3 F (36.8 C) (Oral)   Ht 5' 5.5 (1.664 m)   Wt 191 lb 1.6 oz (86.7 kg)   LMP 07/18/2012   SpO2 98%   BMI 31.32 kg/m    Physical Exam Vitals reviewed.  Constitutional:      Appearance: Normal appearance.  She is well-groomed. She is obese.  HENT:     Right Ear: Tympanic membrane and ear canal normal.     Left Ear: Tympanic membrane and ear canal normal.     Mouth/Throat:     Mouth: Mucous membranes are moist.     Pharynx: No posterior oropharyngeal erythema.  Eyes:     Conjunctiva/sclera: Conjunctivae normal.  Neck:     Thyroid: No thyromegaly.  Cardiovascular:     Rate and Rhythm: Normal rate and regular rhythm.     Pulses: Normal pulses.     Heart sounds: S1 normal and S2 normal.  Pulmonary:     Effort: Pulmonary effort is normal.     Breath sounds: Normal breath sounds and air entry.  Abdominal:     General: Abdomen is flat. Bowel sounds are normal.     Palpations:  Abdomen is soft.  Musculoskeletal:     Right lower leg: No edema.     Left lower leg: No edema.  Lymphadenopathy:     Cervical: No cervical adenopathy.  Neurological:     Mental Status: She is alert and oriented to person, place, and time. Mental status is at baseline.     Gait: Gait is intact.  Psychiatric:        Mood and Affect: Mood and affect normal.        Speech: Speech normal.        Behavior: Behavior normal.        Judgment: Judgment normal.      No results found for any visits on 09/28/23.     Assessment & Plan:    Routine Health Maintenance and Physical Exam  Immunization History  Administered Date(s) Administered   Hepatitis A, Adult 08/27/2020, 02/27/2021   Influenza Split 12/05/2011, 01/10/2014   Influenza Whole 11/23/2007   Influenza,inj,Quad PF,6+ Mos 01/06/2013, 12/25/2014, 12/20/2015, 12/23/2016, 10/22/2017, 12/14/2018   Influenza-Unspecified 01/27/2014, 11/21/2019, 11/17/2020, 11/20/2021, 12/01/2022   PFIZER(Purple Top)SARS-COV-2 Vaccination 04/14/2019, 05/06/2019, 12/13/2019, 07/13/2020, 11/17/2020, 11/20/2021   Tdap 12/05/2011, 02/13/2022   Zoster Recombinant(Shingrix) 11/25/2017, 04/19/2018    Health Maintenance  Topic Date Due   Pneumococcal Vaccine: 50+ Years (1 of 1 - PCV) Never done   COVID-19 Vaccine (7 - 2024-25 season) 10/19/2022   INFLUENZA VACCINE  09/18/2023   HIV Screening  08/27/2024 (Originally 03/31/1975)   Colonoscopy  12/18/2025   Cervical Cancer Screening (HPV/Pap Cotest)  01/15/2026   DTaP/Tdap/Td (3 - Td or Tdap) 02/14/2032   Zoster Vaccines- Shingrix  Completed   Hepatitis C Screening  Addressed   Hepatitis B Vaccines  Aged Out   HPV VACCINES  Aged Out   Meningococcal B Vaccine  Aged Out   MAMMOGRAM  Discontinued    Discussed health benefits of physical activity, and encouraged her to engage in regular exercise appropriate for her age and condition.  Mixed hyperlipidemia -     Lipid panel; Future  Routine general  medical examination at a health care facility -     CBC with Differential/Platelet; Future -     Comprehensive metabolic panel with GFR; Future  Normal physical exam findings. I counseled the patient on the recommended amount of exercise per CDC recommendation. I reviewed preventative screening, immunizations, and medical history and updated in the chart, and appropriate labs and vaccinations were ordered. Handouts given on healthy eating and exercise.    Return in 1 year (on 09/27/2024).     Heron CHRISTELLA Sharper, MD

## 2023-10-02 ENCOUNTER — Ambulatory Visit: Payer: Self-pay | Admitting: Family Medicine

## 2024-09-28 ENCOUNTER — Encounter: Payer: PRIVATE HEALTH INSURANCE | Admitting: Family Medicine

## 2024-10-05 ENCOUNTER — Encounter: Payer: PRIVATE HEALTH INSURANCE | Admitting: Family Medicine
